# Patient Record
Sex: Female | Born: 2000
Health system: Southern US, Community
[De-identification: ages and names within clinical notes are randomized; demographics above are authoritative.]

## PROBLEM LIST (undated history)

## (undated) DIAGNOSIS — F909 Attention-deficit hyperactivity disorder, unspecified type: Secondary | ICD-10-CM

## (undated) HISTORY — DX: Attention-deficit hyperactivity disorder, unspecified type: F90.9

---
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
## 2018-01-26 ENCOUNTER — Other Ambulatory Visit: Payer: Self-pay | Admitting: Advanced Practice Midwife

## 2018-01-26 DIAGNOSIS — Z30013 Encounter for initial prescription of injectable contraceptive: Secondary | ICD-10-CM

## 2018-01-26 MED ORDER — MEDROXYPROGESTERONE ACETATE 150 MG/ML IM SUSP: 150.0000 mg | INTRAMUSCULAR | 3 refills | Status: DC

## 2018-01-26 NOTE — Progress Notes (Signed)
Requesting to restart Depo and D/C Nuvaring.   Katrinka Blazing, [place], CNM 01/26/2018 7:12 PM

## 2018-05-13 DIAGNOSIS — Z0389 Encounter for observation for other suspected diseases and conditions ruled out: Secondary | ICD-10-CM | POA: Diagnosis not present

## 2018-05-13 DIAGNOSIS — Z1388 Encounter for screening for disorder due to exposure to contaminants: Secondary | ICD-10-CM | POA: Diagnosis not present

## 2018-05-13 DIAGNOSIS — Z3009 Encounter for other general counseling and advice on contraception: Secondary | ICD-10-CM | POA: Diagnosis not present

## 2018-11-11 DIAGNOSIS — J029 Acute pharyngitis, unspecified: Secondary | ICD-10-CM | POA: Diagnosis not present

## 2018-11-11 DIAGNOSIS — Z1159 Encounter for screening for other viral diseases: Secondary | ICD-10-CM | POA: Diagnosis not present

## 2019-01-16 ENCOUNTER — Other Ambulatory Visit: Payer: Self-pay | Admitting: Advanced Practice Midwife

## 2019-01-16 DIAGNOSIS — Z30013 Encounter for initial prescription of injectable contraceptive: Secondary | ICD-10-CM

## 2019-01-20 ENCOUNTER — Other Ambulatory Visit: Payer: Self-pay | Admitting: *Deleted

## 2019-01-20 DIAGNOSIS — F329 Major depressive disorder, single episode, unspecified: Secondary | ICD-10-CM

## 2019-01-20 DIAGNOSIS — F32A Depression, unspecified: Secondary | ICD-10-CM

## 2019-01-20 DIAGNOSIS — Z30013 Encounter for initial prescription of injectable contraceptive: Secondary | ICD-10-CM

## 2019-01-20 MED ORDER — CITALOPRAM HYDROBROMIDE 20 MG PO TABS: 20.0000 mg | ORAL_TABLET | Freq: Every day | ORAL | 3 refills | Status: DC

## 2019-01-20 MED ORDER — MEDROXYPROGESTERONE ACETATE 150 MG/ML IM SUSY: 150.0000 mg | PREFILLED_SYRINGE | INTRAMUSCULAR | 3 refills | Status: DC

## 2019-01-25 DIAGNOSIS — U071 COVID-19: Secondary | ICD-10-CM | POA: Diagnosis not present

## 2019-01-25 DIAGNOSIS — J069 Acute upper respiratory infection, unspecified: Secondary | ICD-10-CM | POA: Diagnosis not present

## 2019-04-06 DIAGNOSIS — J039 Acute tonsillitis, unspecified: Secondary | ICD-10-CM | POA: Diagnosis not present

## 2019-04-07 DIAGNOSIS — Z3009 Encounter for other general counseling and advice on contraception: Secondary | ICD-10-CM | POA: Diagnosis not present

## 2019-04-07 DIAGNOSIS — Z0389 Encounter for observation for other suspected diseases and conditions ruled out: Secondary | ICD-10-CM | POA: Diagnosis not present

## 2019-04-07 DIAGNOSIS — Z114 Encounter for screening for human immunodeficiency virus [HIV]: Secondary | ICD-10-CM | POA: Diagnosis not present

## 2019-04-07 DIAGNOSIS — Z23 Encounter for immunization: Secondary | ICD-10-CM | POA: Diagnosis not present

## 2019-04-07 DIAGNOSIS — Z113 Encounter for screening for infections with a predominantly sexual mode of transmission: Secondary | ICD-10-CM | POA: Diagnosis not present

## 2019-04-07 DIAGNOSIS — Z1388 Encounter for screening for disorder due to exposure to contaminants: Secondary | ICD-10-CM | POA: Diagnosis not present

## 2019-04-30 DIAGNOSIS — Z202 Contact with and (suspected) exposure to infections with a predominantly sexual mode of transmission: Secondary | ICD-10-CM | POA: Diagnosis not present

## 2019-08-24 DIAGNOSIS — M545 Low back pain: Secondary | ICD-10-CM | POA: Diagnosis not present

## 2019-08-24 DIAGNOSIS — M9902 Segmental and somatic dysfunction of thoracic region: Secondary | ICD-10-CM | POA: Diagnosis not present

## 2019-08-24 DIAGNOSIS — M9903 Segmental and somatic dysfunction of lumbar region: Secondary | ICD-10-CM | POA: Diagnosis not present

## 2019-08-24 DIAGNOSIS — M9901 Segmental and somatic dysfunction of cervical region: Secondary | ICD-10-CM | POA: Diagnosis not present

## 2019-12-31 ENCOUNTER — Other Ambulatory Visit: Payer: Self-pay

## 2019-12-31 ENCOUNTER — Other Ambulatory Visit (HOSPITAL_COMMUNITY)
Admission: RE | Admit: 2019-12-31 | Discharge: 2019-12-31 | Disposition: A | Discharge status: Home / Self Care | Payer: Medicaid Other | Source: Ambulatory Visit | Attending: Family Medicine | Admitting: Family Medicine

## 2019-12-31 ENCOUNTER — Ambulatory Visit (INDEPENDENT_AMBULATORY_CARE_PROVIDER_SITE_OTHER): Payer: Medicaid Other | Admitting: Family Medicine

## 2019-12-31 ENCOUNTER — Encounter: Payer: Self-pay | Admitting: Family Medicine

## 2019-12-31 VITALS — BP 108/71 | HR 73 | Ht 62.0 in | Wt 125.2 lb

## 2019-12-31 DIAGNOSIS — Z23 Encounter for immunization: Secondary | ICD-10-CM | POA: Diagnosis not present

## 2019-12-31 DIAGNOSIS — Z113 Encounter for screening for infections with a predominantly sexual mode of transmission: Secondary | ICD-10-CM | POA: Diagnosis not present

## 2019-12-31 DIAGNOSIS — Z0000 Encounter for general adult medical examination without abnormal findings: Secondary | ICD-10-CM

## 2019-12-31 DIAGNOSIS — Z124 Encounter for screening for malignant neoplasm of cervix: Secondary | ICD-10-CM | POA: Diagnosis not present

## 2019-12-31 DIAGNOSIS — Z01419 Encounter for gynecological examination (general) (routine) without abnormal findings: Secondary | ICD-10-CM

## 2019-12-31 NOTE — Progress Notes (Signed)
Pt. Presents for annual exam Declines Flu vaccine at this time

## 2019-12-31 NOTE — Progress Notes (Signed)
  Subjective:     Kathy Bates is a 19 y.o. female and is here for a comprehensive physical exam. The patient reports no problems. Not in school right now. Working as a Dispensing optician. Stopped depo due to length of time. Uses condoms faithfully.   The following portions of the patient's history were reviewed and updated as appropriate: allergies, current medications, past family history, past medical history, past social history, past surgical history and problem list.  Review of Systems Pertinent items noted in HPI and remainder of comprehensive ROS otherwise negative.   Objective:    BP 108/71   Pulse 73   Ht 5\' 2"  (1.575 m)   Wt 125 lb 3.2 oz (56.8 kg)   BMI 22.90 kg/m  General appearance: alert, cooperative and appears stated age Head: Normocephalic, without obvious abnormality, atraumatic Neck: no adenopathy, supple, symmetrical, trachea midline and thyroid not enlarged, symmetric, no tenderness/mass/nodules Lungs: clear to auscultation bilaterally Breasts: normal appearance, no masses or tenderness Heart: regular rate and rhythm, S1, S2 normal, no murmur, click, rub or gallop Abdomen: soft, non-tender; bowel sounds normal; no masses,  no organomegaly Pelvic: cervix normal in appearance, external genitalia normal, no adnexal masses or tenderness, no cervical motion tenderness, uterus normal size, shape, and consistency and vagina normal without discharge Extremities: extremities normal, atraumatic, no cyanosis or edema Pulses: 2+ and symmetric Skin: Skin color, texture, turgor normal. No rashes or lesions Lymph nodes: Cervical, supraclavicular, and axillary nodes normal. Neurologic: Grossly normal    Assessment:    Healthy female exam.      Plan:     Screen for STD (sexually transmitted disease) - Plan: Hepatitis B surface antigen, Hepatitis C antibody, HIV Antibody (routine testing w rflx), RPR  Screening for malignant neoplasm of cervix - Plan: Cytology -  PAP  Encounter for gynecological examination without abnormal finding  Return in 1 year (on 12/30/2020).  See After Visit Summary for Counseling Recommendations

## 2019-12-31 NOTE — Patient Instructions (Signed)
 Preventive Care 21-21 Years Old, Female Preventive care refers to visits with your health care provider and lifestyle choices that can promote health and wellness. This includes:  A yearly physical exam. This may also be called an annual well check.  Regular dental visits and eye exams.  Immunizations.  Screening for certain conditions.  Healthy lifestyle choices, such as eating a healthy diet, getting regular exercise, not using drugs or products that contain nicotine and tobacco, and limiting alcohol use. What can I expect for my preventive care visit? Physical exam Your health care provider will check your:  Height and weight. This may be used to calculate body mass index (BMI), which tells if you are at a healthy weight.  Heart rate and blood pressure.  Skin for abnormal spots. Counseling Your health care provider may ask you questions about your:  Alcohol, tobacco, and drug use.  Emotional well-being.  Home and relationship well-being.  Sexual activity.  Eating habits.  Work and work environment.  Method of birth control.  Menstrual cycle.  Pregnancy history. What immunizations do I need?  Influenza (flu) vaccine  This is recommended every year. Tetanus, diphtheria, and pertussis (Tdap) vaccine  You may need a Td booster every 10 years. Varicella (chickenpox) vaccine  You may need this if you have not been vaccinated. Human papillomavirus (HPV) vaccine  If recommended by your health care provider, you may need three doses over 6 months. Measles, mumps, and rubella (MMR) vaccine  You may need at least one dose of MMR. You may also need a second dose. Meningococcal conjugate (MenACWY) vaccine  One dose is recommended if you are age 19-21 years and a first-year college student living in a residence hall, or if you have one of several medical conditions. You may also need additional booster doses. Pneumococcal conjugate (PCV13) vaccine  You may need  this if you have certain conditions and were not previously vaccinated. Pneumococcal polysaccharide (PPSV23) vaccine  You may need one or two doses if you smoke cigarettes or if you have certain conditions. Hepatitis A vaccine  You may need this if you have certain conditions or if you travel or work in places where you may be exposed to hepatitis A. Hepatitis B vaccine  You may need this if you have certain conditions or if you travel or work in places where you may be exposed to hepatitis B. Haemophilus influenzae type b (Hib) vaccine  You may need this if you have certain conditions. You may receive vaccines as individual doses or as more than one vaccine together in one shot (combination vaccines). Talk with your health care provider about the risks and benefits of combination vaccines. What tests do I need?  Blood tests  Lipid and cholesterol levels. These may be checked every 5 years starting at age 20.  Hepatitis C test.  Hepatitis B test. Screening  Diabetes screening. This is done by checking your blood sugar (glucose) after you have not eaten for a while (fasting).  Sexually transmitted disease (STD) testing.  BRCA-related cancer screening. This may be done if you have a family history of breast, ovarian, tubal, or peritoneal cancers.  Pelvic exam and Pap test. This may be done every 3 years starting at age 21. Starting at age 30, this may be done every 5 years if you have a Pap test in combination with an HPV test. Talk with your health care provider about your test results, treatment options, and if necessary, the need for more   tests. Follow these instructions at home: Eating and drinking   Eat a diet that includes fresh fruits and vegetables, whole grains, lean protein, and low-fat dairy.  Take vitamin and mineral supplements as recommended by your health care provider.  Do not drink alcohol if: ? Your health care provider tells you not to drink. ? You are  pregnant, may be pregnant, or are planning to become pregnant.  If you drink alcohol: ? Limit how much you have to 0-1 drink a day. ? Be aware of how much alcohol is in your drink. In the [place] one drink equals one 12 oz bottle of beer (355 mL), one 5 oz glass of wine (148 mL), or one 1 oz glass of hard liquor (44 mL). Lifestyle  Take daily care of your teeth and gums.  Stay active. Exercise for at least 30 minutes on 5 or more days each week.  Do not use any products that contain nicotine or tobacco, such as cigarettes, e-cigarettes, and chewing tobacco. If you need help quitting, ask your health care provider.  If you are sexually active, practice safe sex. Use a condom or other form of birth control (contraception) in order to prevent pregnancy and STIs (sexually transmitted infections). If you plan to become pregnant, see your health care provider for a preconception visit. What's next?  Visit your health care provider once a year for a well check visit.  Ask your health care provider how often you should have your eyes and teeth checked.  Stay up to date on all vaccines. This information is not intended to replace advice given to you by your health care provider. Make sure you discuss any questions you have with your health care provider. Document Revised: 12/19/2017 Document Reviewed: 12/19/2017 Elsevier Patient Education  2020 Elsevier Inc.  

## 2020-01-01 ENCOUNTER — Telehealth: Payer: Self-pay | Admitting: *Deleted

## 2020-01-01 LAB — HEPATITIS B SURFACE ANTIGEN: Hepatitis B Surface Ag: NEGATIVE

## 2020-01-01 LAB — RPR: RPR Ser Ql: NONREACTIVE

## 2020-01-01 LAB — HEPATITIS C ANTIBODY: Hep C Virus Ab: <0.1 s/co ratio (ref 0.0–0.9)

## 2020-01-01 LAB — HIV ANTIBODY (ROUTINE TESTING W REFLEX): HIV Screen 4th Generation wRfx: NONREACTIVE

## 2020-01-01 MED ORDER — FLUCONAZOLE 150 MG PO TABS: 150.0000 mg | ORAL_TABLET | Freq: Once | ORAL | 1 refills | Status: AC

## 2020-01-01 NOTE — Telephone Encounter (Signed)
Pt called requesting med for yeast, she forgot to ask Dr Shawnie Pons yesterday at her appt.

## 2020-01-04 LAB — CYTOLOGY - PAP
Adequacy: ABSENT
Chlamydia: NEGATIVE
Comment: NEGATIVE
Comment: NEGATIVE
Comment: NORMAL
Diagnosis: NEGATIVE
Neisseria Gonorrhea: NEGATIVE
Trichomonas: NEGATIVE

## 2020-02-01 ENCOUNTER — Other Ambulatory Visit: Payer: Self-pay

## 2020-02-01 DIAGNOSIS — Z79899 Other long term (current) drug therapy: Secondary | ICD-10-CM

## 2020-03-24 DIAGNOSIS — Z20822 Contact with and (suspected) exposure to covid-19: Secondary | ICD-10-CM | POA: Diagnosis not present

## 2020-03-24 DIAGNOSIS — R059 Cough, unspecified: Secondary | ICD-10-CM | POA: Diagnosis not present

## 2020-06-15 DIAGNOSIS — S6991XA Unspecified injury of right wrist, hand and finger(s), initial encounter: Secondary | ICD-10-CM | POA: Diagnosis not present

## 2020-07-11 ENCOUNTER — Telehealth: Payer: Medicaid Other | Admitting: Physician Assistant

## 2020-07-11 NOTE — Progress Notes (Signed)
Message sent to patient requesting further input regarding current symptoms. Awaiting patient response.  

## 2020-07-11 NOTE — Progress Notes (Signed)
[place]. Cannot send medication to [place]. Instructed her to reach out to PCP or be seen at local Urgent Care

## 2020-09-05 ENCOUNTER — Other Ambulatory Visit: Payer: Self-pay | Admitting: *Deleted

## 2020-09-05 DIAGNOSIS — F32A Depression, unspecified: Secondary | ICD-10-CM

## 2020-09-05 MED ORDER — CITALOPRAM HYDROBROMIDE 20 MG PO TABS: 20.0000 mg | ORAL_TABLET | Freq: Every day | ORAL | 1 refills | Status: DC

## 2020-11-23 ENCOUNTER — Encounter: Payer: Self-pay | Admitting: Radiology

## 2020-12-27 ENCOUNTER — Telehealth: Payer: Self-pay

## 2020-12-27 NOTE — Telephone Encounter (Signed)
Unable to leave a message vmail is full. Called to move pt from sept 22 to another date

## 2021-01-12 ENCOUNTER — Ambulatory Visit: Payer: Medicaid Other | Admitting: Family Medicine

## 2021-01-31 ENCOUNTER — Ambulatory Visit: Payer: Medicaid Other | Admitting: Family Medicine

## 2021-04-21 ENCOUNTER — Ambulatory Visit
Admission: EM | Admit: 2021-04-21 | Discharge: 2021-04-21 | Disposition: A | Discharge status: Home / Self Care | Payer: Medicaid Other | Attending: Emergency Medicine | Admitting: Emergency Medicine

## 2021-04-21 ENCOUNTER — Other Ambulatory Visit: Payer: Self-pay

## 2021-04-21 DIAGNOSIS — Z20822 Contact with and (suspected) exposure to covid-19: Secondary | ICD-10-CM

## 2021-04-21 DIAGNOSIS — B349 Viral infection, unspecified: Secondary | ICD-10-CM

## 2021-04-21 NOTE — ED Triage Notes (Signed)
Pt reports bilateral ear discomfort and sore throat since 04/13/21; pt was exposed to COVID on 04/16/21.

## 2021-04-21 NOTE — ED Provider Notes (Signed)
Kathy Bates    CSN: [id number] Arrival date & time: 04/21/21  7846      History   Chief Complaint Chief Complaint  Patient presents with   Otalgia    HPI Kathy Bates is a 20 y.o. female.  Patient presents with 6-day history of ears popping and sore throat.  She also reports chills, fatigue, headache, congestion, cough, shortness of breath x2 to 3 days.  She was exposed to someone with COVID on 04/16/2021.  Treatment at home with Tylenol.  She denies fever, rash, vomiting, diarrhea, or other symptoms.  Her medical history includes scoliosis, ADHD, anxiety, depression.    The history is provided by the patient and medical records.   Past Medical History:  Diagnosis Date   ADHD (attention deficit hyperactivity disorder)     Patient Active Problem List   Diagnosis Date Noted   Lactose intolerance 07/09/2017   Attention deficit hyperactivity disorder (ADHD) 10/18/2015   Other specified depressive episodes 10/18/2015   Parent-child relational problem 10/18/2015   Adolescent idiopathic scoliosis of thoracolumbar region 05/09/2015   Generalized anxiety disorder 05/09/2015   Shoulder pain 09/05/2012    History reviewed. No pertinent surgical history.  OB History     Gravida  0   Para  0   Term  0   Preterm  0   AB  0   Living  0      SAB  0   IAB  0   Ectopic  0   Multiple  0   Live Births               Home Medications    Prior to Admission medications   Medication Sig Start Date End Date Taking? Authorizing Provider  citalopram (CELEXA) 20 MG tablet Take 1 tablet (20 mg total) by mouth daily. 09/05/20   Reva Bores, MD  lactase (LACTAID) 3000 units tablet Take 1 tablet (3,000 Units total) by mouth 3 (three) times daily with meals. 07/09/17   Reva Bores, MD  medroxyPROGESTERone Acetate 150 MG/ML SUSY Inject 1 mL (150 mg total) into the muscle every 3 (three) months. Patient not taking: Reported on 12/31/2019 01/20/19   Reva Bores, MD    Family History Family History  Problem Relation Age of Onset   Diabetes Father    Hypertension Father     Social History Social History   Tobacco Use   Smoking status: Never   Smokeless tobacco: Never  Substance Use Topics   Alcohol use: No   Drug use: No     Allergies   Patient has no known allergies.   Review of Systems Review of Systems  Constitutional:  Positive for chills and fatigue. Negative for fever.  HENT:  Positive for congestion, ear pain and sore throat.   Eyes:  Negative for pain and visual disturbance.  Respiratory:  Positive for cough and shortness of breath.   Cardiovascular:  Negative for chest pain and palpitations.  Gastrointestinal:  Negative for diarrhea and vomiting.  Skin:  Negative for color change and rash.  Neurological:  Positive for headaches. Negative for syncope.  All other systems reviewed and are negative.   Physical Exam Triage Vital Signs ED Triage Vitals [04/21/21 0912]  Enc Vitals Group     BP 119/77     Pulse Rate 75     Resp 16     Temp 98.7 F (37.1 C)     Temp Source Oral  SpO2 95 %     Weight      Height      Head Circumference      Peak Flow      Pain Score      Pain Loc      Pain Edu?      Excl. in [place]?    No data found.  Updated Vital Signs BP 119/77 (BP Location: Left Arm)    Pulse 75    Temp 98.7 F (37.1 C) (Oral)    Resp 16    LMP  (Within Months) Comment: 1 month   SpO2 95%   Visual Acuity Right Eye Distance:   Left Eye Distance:   Bilateral Distance:    Right Eye Near:   Left Eye Near:    Bilateral Near:     Physical Exam Vitals and nursing note reviewed.  Constitutional:      General: She is not in acute distress.    Appearance: Normal appearance. She is well-developed.  HENT:     Right Ear: Tympanic membrane normal.     Left Ear: Tympanic membrane normal.     Nose: Congestion present.     Mouth/Throat:     Mouth: Mucous membranes are moist.     Pharynx: Oropharynx  is clear.  Cardiovascular:     Rate and Rhythm: Normal rate and regular rhythm.     Heart sounds: Normal heart sounds.  Pulmonary:     Effort: Pulmonary effort is normal. No respiratory distress.     Breath sounds: Normal breath sounds.  Abdominal:     Palpations: Abdomen is soft.     Tenderness: There is no abdominal tenderness.  Musculoskeletal:     Cervical back: Neck supple.  Skin:    General: Skin is warm and dry.  Neurological:     Mental Status: She is alert.  Psychiatric:        Mood and Affect: Mood normal.        Behavior: Behavior normal.     UC Treatments / Results  Labs (all labs ordered are listed, but only abnormal results are displayed) Labs Reviewed  NOVEL CORONAVIRUS, NAA    EKG   Radiology No results found.  Procedures Procedures (including critical care time)  Medications Ordered in UC Medications - No data to display  Initial Impression / Assessment and Plan / UC Course  I have reviewed the triage vital signs and the nursing notes.  Pertinent labs & imaging results that were available during my care of the patient were reviewed by me and considered in my medical decision making (see chart for details).   Exposure to COVID. Viral illness.  COVID pending.  Instructed patient to self quarantine per CDC guidelines.  Discussed symptomatic treatment including Tylenol or ibuprofen, rest, hydration.  Instructed patient to follow up with PCP if symptoms are not improving.  Patient agrees to plan of care.    Final Clinical Impressions(s) / UC Diagnoses   Final diagnoses:  Exposure to COVID-19 virus  Viral illness     Discharge Instructions      Your COVID is pending.  You should self quarantine until the test result is back.    Take Tylenol or ibuprofen as needed for fever or discomfort.  Rest and keep yourself hydrated.    Follow-up with your primary care provider if your symptoms are not improving.         ED Prescriptions   None     PDMP not  reviewed this encounter.   Sharion Balloon, NP 04/21/21 [phone]

## 2021-04-21 NOTE — Discharge Instructions (Addendum)
Your COVID is pending.  You should self quarantine until the test result is back.    Take Tylenol or ibuprofen as needed for fever or discomfort.  Rest and keep yourself hydrated.    Follow-up with your primary care provider if your symptoms are not improving.

## 2021-04-22 LAB — NOVEL CORONAVIRUS, NAA: SARS-CoV-2, NAA: DETECTED — AB

## 2021-04-22 LAB — SARS-COV-2, NAA 2 DAY TAT

## 2021-05-15 ENCOUNTER — Other Ambulatory Visit: Payer: Self-pay | Admitting: *Deleted

## 2021-05-15 DIAGNOSIS — F32A Depression, unspecified: Secondary | ICD-10-CM

## 2021-05-15 MED ORDER — CITALOPRAM HYDROBROMIDE 20 MG PO TABS: 20.0000 mg | ORAL_TABLET | Freq: Every day | ORAL | 1 refills | Status: DC

## 2021-07-05 ENCOUNTER — Ambulatory Visit: Payer: Medicaid Other | Admitting: Family Medicine

## 2021-08-23 ENCOUNTER — Encounter: Payer: Self-pay | Admitting: Family Medicine

## 2021-08-23 ENCOUNTER — Other Ambulatory Visit (HOSPITAL_COMMUNITY)
Admission: RE | Admit: 2021-08-23 | Discharge: 2021-08-23 | Disposition: A | Discharge status: Home / Self Care | Payer: Medicaid Other | Source: Ambulatory Visit | Attending: Family Medicine | Admitting: Family Medicine

## 2021-08-23 ENCOUNTER — Ambulatory Visit (INDEPENDENT_AMBULATORY_CARE_PROVIDER_SITE_OTHER): Payer: Medicaid Other | Admitting: Family Medicine

## 2021-08-23 VITALS — BP 108/70 | HR 79 | Ht 62.0 in | Wt 117.0 lb

## 2021-08-23 DIAGNOSIS — Z124 Encounter for screening for malignant neoplasm of cervix: Secondary | ICD-10-CM | POA: Diagnosis not present

## 2021-08-23 DIAGNOSIS — Z01419 Encounter for gynecological examination (general) (routine) without abnormal findings: Secondary | ICD-10-CM | POA: Diagnosis not present

## 2021-08-23 DIAGNOSIS — N898 Other specified noninflammatory disorders of vagina: Secondary | ICD-10-CM | POA: Diagnosis not present

## 2021-08-23 DIAGNOSIS — Z113 Encounter for screening for infections with a predominantly sexual mode of transmission: Secondary | ICD-10-CM | POA: Diagnosis not present

## 2021-08-23 DIAGNOSIS — F331 Major depressive disorder, recurrent, moderate: Secondary | ICD-10-CM | POA: Insufficient documentation

## 2021-08-23 HISTORY — DX: Major depressive disorder, recurrent, moderate: F33.1

## 2021-08-23 MED ORDER — CITALOPRAM HYDROBROMIDE 20 MG PO TABS: 20.0000 mg | ORAL_TABLET | Freq: Every day | ORAL | 1 refills | Status: DC

## 2021-08-23 NOTE — Patient Instructions (Signed)
Preventive Care 21-23 Years Old, Female ?Preventive care refers to lifestyle choices and visits with your health care provider that can promote health and wellness. Preventive care visits are also called wellness exams. ?What can I expect for my preventive care visit? ?Counseling ?During your preventive care visit, your health care provider may ask about your: ?Medical history, including: ?Past medical problems. ?Family medical history. ?Pregnancy history. ?Current health, including: ?Menstrual cycle. ?Method of birth control. ?Emotional well-being. ?Home life and relationship well-being. ?Sexual activity and sexual health. ?Lifestyle, including: ?Alcohol, nicotine or tobacco, and drug use. ?Access to firearms. ?Diet, exercise, and sleep habits. ?Work and work environment. ?Sunscreen use. ?Safety issues such as seatbelt and bike helmet use. ?Physical exam ?Your health care provider may check your: ?Height and weight. These may be used to calculate your BMI (body mass index). BMI is a measurement that tells if you are at a healthy weight. ?Waist circumference. This measures the distance around your waistline. This measurement also tells if you are at a healthy weight and may help predict your risk of certain diseases, such as type 2 diabetes and high blood pressure. ?Heart rate and blood pressure. ?Body temperature. ?Skin for abnormal spots. ?What immunizations do I need? ? ?Vaccines are usually given at various ages, according to a schedule. Your health care provider will recommend vaccines for you based on your age, medical history, and lifestyle or other factors, such as travel or where you work. ?What tests do I need? ?Screening ?Your health care provider may recommend screening tests for certain conditions. This may include: ?Pelvic exam and Pap test. ?Lipid and cholesterol levels. ?Diabetes screening. This is done by checking your blood sugar (glucose) after you have not eaten for a while (fasting). ?Hepatitis  B test. ?Hepatitis C test. ?HIV (human immunodeficiency virus) test. ?STI (sexually transmitted infection) testing, if you are at risk. ?BRCA-related cancer screening. This may be done if you have a family history of breast, ovarian, tubal, or peritoneal cancers. ?Talk with your health care provider about your test results, treatment options, and if necessary, the need for more tests. ?Follow these instructions at home: ?Eating and drinking ? ?Eat a healthy diet that includes fresh fruits and vegetables, whole grains, lean protein, and low-fat dairy products. ?Take vitamin and mineral supplements as recommended by your health care provider. ?Do not drink alcohol if: ?Your health care provider tells you not to drink. ?You are pregnant, may be pregnant, or are planning to become pregnant. ?If you drink alcohol: ?Limit how much you have to 0-1 drink a day. ?Know how much alcohol is in your drink. In the [place] one drink equals one 12 oz bottle of beer (355 mL), one 5 oz glass of wine (148 mL), or one 1? oz glass of hard liquor (44 mL). ?Lifestyle ?Brush your teeth every morning and night with fluoride toothpaste. Floss one time each day. ?Exercise for at least 30 minutes 5 or more days each week. ?Do not use any products that contain nicotine or tobacco. These products include cigarettes, chewing tobacco, and vaping devices, such as e-cigarettes. If you need help quitting, ask your health care provider. ?Do not use drugs. ?If you are sexually active, practice safe sex. Use a condom or other form of protection to prevent STIs. ?If you do not wish to become pregnant, use a form of birth control. If you plan to become pregnant, see your health care provider for a prepregnancy visit. ?Find healthy ways to manage stress, such as: ?Meditation,   yoga, or listening to music. ?Journaling. ?Talking to a trusted person. ?Spending time with friends and family. ?Minimize exposure to UV radiation to reduce your risk of skin  cancer. ?Safety ?Always wear your seat belt while driving or riding in a vehicle. ?Do not drive: ?If you have been drinking alcohol. Do not ride with someone who has been drinking. ?If you have been using any mind-altering substances or drugs. ?While texting. ?When you are tired or distracted. ?Wear a helmet and other protective equipment during sports activities. ?If you have firearms in your house, make sure you follow all gun safety procedures. ?Seek help if you have been physically or sexually abused. ?What's next? ?Go to your health care provider once a year for an annual wellness visit. ?Ask your health care provider how often you should have your eyes and teeth checked. ?Stay up to date on all vaccines. ?This information is not intended to replace advice given to you by your health care provider. Make sure you discuss any questions you have with your health care provider. ?Document Revised: 10/05/2020 Document Reviewed: 10/05/2020 ?Elsevier Patient Education ? [place]. ? ?

## 2021-08-23 NOTE — Progress Notes (Signed)
Patient presents for Annual Exam. ? ?LMP:07/25/21 ?Contraception: None at this time however wants to discuss Depo notes unprotected intercourse last week "pull out method". ?STD Screening: Desires  ?Last pap: 12/2019 Wants pap. ?Family Hx of Breast Cancer: None  ? ?Pt wants to discuss management of medication for ADD notes going back to school here soon.  ?Also needs discuss Celexa.  ? ? ? ?

## 2021-08-23 NOTE — Progress Notes (Signed)
Subjective:  ?  ? Kathy Bates is a 21 y.o. female and is here for a comprehensive physical exam. The patient reports no problems. Wants to get back on Depo. LMP 07/25/2021 with unprotected intercourse last week.  ?Wants to resume her Celexa, has been taking intermittently secondary to side fx of dizziness, feels like zombie. Etc. ?Wants to return to school and resume her ADD meds--on Focalin in the past with stopping it in elementary school, because she did not eat--and grades went from As to C/Ds. ? ? ?The following portions of the patient's history were reviewed and updated as appropriate: allergies, current medications, past family history, past medical history, past social history, past surgical history, and problem list. ? ?Review of Systems ?Pertinent items noted in HPI and remainder of comprehensive ROS otherwise negative.  ? ?Objective:  ? ? BP 108/70   Pulse 79   Ht 5\' 2"  (1.575 m)   Wt 117 lb (53.1 kg)   LMP 07/25/2021   BMI 21.40 kg/m?  ?General appearance: alert, cooperative, and appears stated age ?Head: Normocephalic, without obvious abnormality, atraumatic ?Neck: no adenopathy, supple, symmetrical, trachea midline, and thyroid not enlarged, symmetric, no tenderness/mass/nodules ?Lungs: clear to auscultation bilaterally ?Breasts: normal appearance, no masses or tenderness ?Heart: regular rate and rhythm, S1, S2 normal, no murmur, click, rub or gallop ?Abdomen: soft, non-tender; bowel sounds normal; no masses,  no organomegaly ?Pelvic: cervix normal in appearance, external genitalia normal, no adnexal masses or tenderness, no cervical motion tenderness, uterus normal size, shape, and consistency, and vagina normal without discharge ?Extremities: extremities normal, atraumatic, no cyanosis or edema ?Pulses: 2+ and symmetric ?Skin: Skin color, texture, turgor normal. No rashes or lesions ?Lymph nodes: Cervical, supraclavicular, and axillary nodes normal. ?Neurologic: Grossly normal  ?   ?Assessment:  ? ? Healthy female exam.    ?  ?Plan:  ? ?Problem List Items Addressed This Visit   ? ?  ? Unprioritized  ? Moderate episode of recurrent major depressive disorder ([name])  ?  Resume Celexa--start with 1/2 tab and increase to full tab--discussed other options--has some anxiety as well--Has been on zoloft and lexapro in the past and had to stop them for a variety of reasons. ? ?  ?  ? Relevant Medications  ? citalopram (CELEXA) 20 MG tablet  ? ?Other Visit Diagnoses   ? ? Screening for malignant neoplasm of cervix    -  Primary  ? Relevant Orders  ? Cytology - PAP  ? Encounter for gynecological examination without abnormal finding      ? Screen for STD (sexually transmitted disease)      ? Relevant Orders  ? Hepatitis C antibody  ? Hepatitis B surface antigen  ? RPR  ? HIV Antibody (routine testing w rflx)  ? Vaginal discharge      ? Relevant Orders  ? Cervicovaginal ancillary only( [place])  ? ?  ? ?Return in 8 weeks (on 10/18/2021) for primary care referral, a follow-up, virtual--rtn w/ menses or in 2 weeks with neg UPT for Depo. ? ? ?See After Visit Summary for Counseling Recommendations  ? ?

## 2021-08-23 NOTE — Assessment & Plan Note (Signed)
Resume Celexa--start with 1/2 tab and increase to full tab--discussed other options--has some anxiety as well--Has been on zoloft and lexapro in the past and had to stop them for a variety of reasons. ?

## 2021-08-24 LAB — HEPATITIS B SURFACE ANTIGEN: Hepatitis B Surface Ag: NEGATIVE

## 2021-08-24 LAB — CERVICOVAGINAL ANCILLARY ONLY
Bacterial Vaginitis (gardnerella): NEGATIVE
Candida Glabrata: NEGATIVE
Candida Vaginitis: NEGATIVE
Comment: NEGATIVE
Comment: NEGATIVE
Comment: NEGATIVE

## 2021-08-24 LAB — RPR: RPR Ser Ql: NONREACTIVE

## 2021-08-24 LAB — HEPATITIS C ANTIBODY: Hep C Virus Ab: NONREACTIVE

## 2021-08-24 LAB — HIV ANTIBODY (ROUTINE TESTING W REFLEX): HIV Screen 4th Generation wRfx: NONREACTIVE

## 2021-08-25 LAB — CYTOLOGY - PAP
Adequacy: ABSENT
Chlamydia: NEGATIVE
Comment: NEGATIVE
Comment: NORMAL
Diagnosis: NEGATIVE
Neisseria Gonorrhea: NEGATIVE

## 2021-09-04 DIAGNOSIS — F320 Major depressive disorder, single episode, mild: Secondary | ICD-10-CM | POA: Diagnosis not present

## 2021-09-11 DIAGNOSIS — F320 Major depressive disorder, single episode, mild: Secondary | ICD-10-CM | POA: Diagnosis not present

## 2021-09-25 DIAGNOSIS — F320 Major depressive disorder, single episode, mild: Secondary | ICD-10-CM | POA: Diagnosis not present

## 2021-10-03 DIAGNOSIS — F320 Major depressive disorder, single episode, mild: Secondary | ICD-10-CM | POA: Diagnosis not present

## 2021-10-09 DIAGNOSIS — F320 Major depressive disorder, single episode, mild: Secondary | ICD-10-CM | POA: Diagnosis not present

## 2021-10-11 ENCOUNTER — Telehealth: Payer: Medicaid Other | Admitting: Family Medicine

## 2021-10-11 ENCOUNTER — Encounter: Payer: Self-pay | Admitting: Family Medicine

## 2021-10-11 DIAGNOSIS — Z3042 Encounter for surveillance of injectable contraceptive: Secondary | ICD-10-CM

## 2021-10-11 DIAGNOSIS — F331 Major depressive disorder, recurrent, moderate: Secondary | ICD-10-CM

## 2021-10-11 NOTE — Assessment & Plan Note (Signed)
Continue Celexa

## 2021-10-11 NOTE — Progress Notes (Signed)
Virtual Follow-Up visit.  Patient states she has no complaints or concerns.

## 2021-10-11 NOTE — Progress Notes (Signed)
    GYNECOLOGY VIRTUAL VISIT ENCOUNTER NOTE  Provider location: [facility] for [facility]   Patient location: Home  I connected with Kathy Bates on 10/11/21 at 11:15 AM EDT by MyChart Video Encounter and verified that I am speaking with the correct person using two identifiers.   I discussed the limitations, risks, security and privacy concerns of performing an evaluation and management service virtually and the availability of in person appointments. I also discussed with the patient that there may be a patient responsible charge related to this service. The patient expressed understanding and agreed to proceed.   History:  Kathy Bates is a 21 y.o. G0P0000 female being evaluated today for possible depo. Resumed her Celexa--it makes her sleepy. She will take it at night. She denies any abnormal vaginal discharge, bleeding, pelvic pain or other concerns.      Past Medical History:  Diagnosis Date   ADHD (attention deficit hyperactivity disorder)    History reviewed. No pertinent surgical history. The following portions of the patient's history were reviewed and updated as appropriate: allergies, current medications, past family history, past medical history, past social history, past surgical history and problem list.   Health Maintenance:  Normal pap and on 08/24/2022.   Review of Systems:  Pertinent items noted in HPI and remainder of comprehensive ROS otherwise negative.  Physical Exam:   General:  Alert, oriented and cooperative. Patient appears to be in no acute distress.  Mental Status: Normal mood and affect. Normal behavior. Normal judgment and thought content.   Respiratory: Normal respiratory effort, no problems with respiration noted  Rest of physical exam deferred due to type of encounter  Labs and Imaging No results found for this or any previous visit (from the past 336 hour(s)). No results found.     Assessment and Plan:      Problem List Items Addressed This Visit       Unprioritized   Moderate episode of recurrent major depressive disorder (HCC)    Continue Celexa      Other Visit Diagnoses     Encounter for surveillance of injectable contraceptive    -  Primary   return with menses and can resume Depo            I discussed the assessment and treatment plan with the patient. The patient was provided an opportunity to ask questions and all were answered. The patient agreed with the plan and demonstrated an understanding of the instructions.   The patient was advised to call back or seek an in-person evaluation/go to the ED if the symptoms worsen or if the condition fails to improve as anticipated.  I provided 5 minutes of face-to-face time during this encounter.   Reva Bores, MD Center for [employer], [facility]

## 2021-10-16 DIAGNOSIS — F320 Major depressive disorder, single episode, mild: Secondary | ICD-10-CM | POA: Diagnosis not present

## 2021-10-17 ENCOUNTER — Other Ambulatory Visit: Payer: Medicaid Other

## 2021-10-17 DIAGNOSIS — N912 Amenorrhea, unspecified: Secondary | ICD-10-CM | POA: Diagnosis not present

## 2021-10-18 LAB — BETA HCG QUANT (REF LAB): hCG Quant: 557 m[IU]/mL

## 2021-10-23 DIAGNOSIS — F320 Major depressive disorder, single episode, mild: Secondary | ICD-10-CM | POA: Diagnosis not present

## 2021-11-03 DIAGNOSIS — F320 Major depressive disorder, single episode, mild: Secondary | ICD-10-CM | POA: Diagnosis not present

## 2021-11-06 ENCOUNTER — Ambulatory Visit (INDEPENDENT_AMBULATORY_CARE_PROVIDER_SITE_OTHER): Payer: Medicaid Other | Admitting: *Deleted

## 2021-11-06 ENCOUNTER — Ambulatory Visit (INDEPENDENT_AMBULATORY_CARE_PROVIDER_SITE_OTHER): Payer: Medicaid Other

## 2021-11-06 VITALS — BP 109/69 | HR 83 | Wt 112.0 lb

## 2021-11-06 DIAGNOSIS — Z3401 Encounter for supervision of normal first pregnancy, first trimester: Secondary | ICD-10-CM

## 2021-11-06 DIAGNOSIS — Z3400 Encounter for supervision of normal first pregnancy, unspecified trimester: Secondary | ICD-10-CM | POA: Insufficient documentation

## 2021-11-06 DIAGNOSIS — Z3A01 Less than 8 weeks gestation of pregnancy: Secondary | ICD-10-CM | POA: Diagnosis not present

## 2021-11-06 DIAGNOSIS — F320 Major depressive disorder, single episode, mild: Secondary | ICD-10-CM | POA: Diagnosis not present

## 2021-11-06 DIAGNOSIS — O3680X0 Pregnancy with inconclusive fetal viability, not applicable or unspecified: Secondary | ICD-10-CM

## 2021-11-06 NOTE — Progress Notes (Cosign Needed)
New OB Intake  I explained I am completing New OB Intake today. We discussed her EDD of 06/28/22 that is based on today's scan. LMP of unknown. Pt is G1/P0. I reviewed her allergies, medications, Medical/Surgical/OB history, and appropriate screenings.   Patient Active Problem List   Diagnosis Date Noted   Encounter for supervision of normal first pregnancy in first trimester 11/06/2021   Attention deficit hyperactivity disorder (ADHD) 10/18/2015   Adolescent idiopathic scoliosis of thoracolumbar region 05/09/2015   Generalized anxiety disorder 05/09/2015    Concerns addressed today  Delivery Plans:  Plans to deliver at [facility]   MyChart/Babyscripts MyChart access verified. I explained pt will have some visits in office and some virtually. Babyscripts app discussed and ordered.   Anatomy US Explained first scheduled [place] will be around 19 weeks.   Labs Discussed Avelina Laine genetic screening with patient. Would like both Panorama and Horizon drawn at new OB visit. Routine prenatal labs needed.   Placed OB Box on problem list and updated   Patient informed that the ultrasound is considered a limited obstetric ultrasound and is not intended to be a complete ultrasound exam.  Patient also informed that the ultrasound is not being completed with the intent of assessing for fetal or placental anomalies or any pelvic abnormalities. Explained that the purpose of today's ultrasound is to assess for dating and fetal heart rate.  Patient acknowledges the purpose of the exam and the limitations of the study.      First visit review I reviewed new OB appt with pt. I explained she will have ob bloodwork with genetic screening. Explained pt will be seen by Thalia Bloodgood, CNM at first visit.    Scheryl Marten, RN 11/06/2021  12:02 PM

## 2021-11-08 NOTE — Progress Notes (Signed)
Attestation of Attending Supervision of clinical support staff: I agree with the care provided to this patient and was available for any consultation.  I have reviewed the RN's note and chart. I was available for consult and to see the patient if needed.   Alexxia Stankiewicz MD MPH Attending Physician Faculty Practice- [facility]  

## 2021-11-11 DIAGNOSIS — Z3201 Encounter for pregnancy test, result positive: Secondary | ICD-10-CM | POA: Diagnosis not present

## 2021-11-11 DIAGNOSIS — Z3A01 Less than 8 weeks gestation of pregnancy: Secondary | ICD-10-CM | POA: Diagnosis not present

## 2021-11-11 DIAGNOSIS — R110 Nausea: Secondary | ICD-10-CM | POA: Diagnosis not present

## 2021-11-11 DIAGNOSIS — O219 Vomiting of pregnancy, unspecified: Secondary | ICD-10-CM | POA: Diagnosis not present

## 2021-11-20 DIAGNOSIS — F320 Major depressive disorder, single episode, mild: Secondary | ICD-10-CM | POA: Diagnosis not present

## 2021-12-04 DIAGNOSIS — F320 Major depressive disorder, single episode, mild: Secondary | ICD-10-CM | POA: Diagnosis not present

## 2021-12-06 ENCOUNTER — Other Ambulatory Visit (HOSPITAL_COMMUNITY)
Admission: RE | Admit: 2021-12-06 | Discharge: 2021-12-06 | Disposition: A | Discharge status: Home / Self Care | Payer: Medicaid Other | Source: Ambulatory Visit | Attending: Family Medicine | Admitting: Family Medicine

## 2021-12-06 ENCOUNTER — Ambulatory Visit (INDEPENDENT_AMBULATORY_CARE_PROVIDER_SITE_OTHER): Payer: Medicaid Other | Admitting: *Deleted

## 2021-12-06 VITALS — BP 112/77 | HR 80

## 2021-12-06 DIAGNOSIS — Z113 Encounter for screening for infections with a predominantly sexual mode of transmission: Secondary | ICD-10-CM | POA: Insufficient documentation

## 2021-12-06 LAB — OB RESULTS CONSOLE GC/CHLAMYDIA
Chlamydia: NEGATIVE
Neisseria Gonorrhea: NEGATIVE

## 2021-12-06 NOTE — Progress Notes (Cosign Needed)
SUBJECTIVE:  21 y.o. female who desires a STI screen. Denies abnormal vaginal discharge, bleeding or significant pelvic pain. No UTI symptoms. Possibly exposed to Trich from previous partner.  Patient's last menstrual period was 08/21/2021 (approximate).  OBJECTIVE:  She appears well.   ASSESSMENT:  STI Screen   PLAN:  Pt offered STI blood screening-not indicated GC, chlamydia, and trichomonas, CVAG, BVAG  probe sent to lab.  Treatment: To be determined once lab results are received.  Pt follow up as needed.   Scheryl Marten, RN

## 2021-12-06 NOTE — Progress Notes (Addendum)
Patient seen and assessed by nursing staff.  Agree with documentation and plan.Has BV and yeast, rx given

## 2021-12-08 ENCOUNTER — Other Ambulatory Visit: Payer: Self-pay | Admitting: Family Medicine

## 2021-12-08 LAB — CERVICOVAGINAL ANCILLARY ONLY
Bacterial Vaginitis (gardnerella): POSITIVE — AB
Candida Glabrata: NEGATIVE
Candida Vaginitis: POSITIVE — AB
Chlamydia: NEGATIVE
Comment: NEGATIVE
Comment: NEGATIVE
Comment: NEGATIVE
Comment: NEGATIVE
Comment: NEGATIVE
Comment: NORMAL
Neisseria Gonorrhea: NEGATIVE
Trichomonas: NEGATIVE

## 2021-12-08 MED ORDER — METRONIDAZOLE 500 MG PO TABS: 500.0000 mg | ORAL_TABLET | Freq: Two times a day (BID) | ORAL | 0 refills | Status: DC

## 2021-12-08 MED ORDER — FLUCONAZOLE 150 MG PO TABS: 150.0000 mg | ORAL_TABLET | Freq: Once | ORAL | 0 refills | Status: AC

## 2021-12-08 NOTE — Addendum Note (Signed)
Addended by: Reva Bores on: 12/08/2021 03:10 PM   Modules accepted: Orders

## 2021-12-11 ENCOUNTER — Other Ambulatory Visit: Payer: Self-pay | Admitting: *Deleted

## 2021-12-11 MED ORDER — TERCONAZOLE 0.8 % VA CREA: 1.0000 | TOPICAL_CREAM | Freq: Every day | VAGINAL | 0 refills | Status: DC

## 2021-12-11 MED ORDER — METRONIDAZOLE 500 MG PO TABS: 500.0000 mg | ORAL_TABLET | Freq: Two times a day (BID) | ORAL | 0 refills | Status: DC

## 2021-12-12 ENCOUNTER — Ambulatory Visit: Payer: Medicaid Other

## 2021-12-14 ENCOUNTER — Ambulatory Visit (INDEPENDENT_AMBULATORY_CARE_PROVIDER_SITE_OTHER): Payer: Medicaid Other | Admitting: Advanced Practice Midwife

## 2021-12-14 VITALS — BP 112/71 | HR 84 | Wt 116.0 lb

## 2021-12-14 DIAGNOSIS — Z3A12 12 weeks gestation of pregnancy: Secondary | ICD-10-CM

## 2021-12-14 DIAGNOSIS — N898 Other specified noninflammatory disorders of vagina: Secondary | ICD-10-CM

## 2021-12-14 DIAGNOSIS — Z3401 Encounter for supervision of normal first pregnancy, first trimester: Secondary | ICD-10-CM | POA: Diagnosis not present

## 2021-12-14 DIAGNOSIS — F411 Generalized anxiety disorder: Secondary | ICD-10-CM

## 2021-12-14 LAB — OB RESULTS CONSOLE RUBELLA ANTIBODY, IGM: Rubella: IMMUNE

## 2021-12-14 LAB — OB RESULTS CONSOLE HIV ANTIBODY (ROUTINE TESTING): HIV: NONREACTIVE

## 2021-12-14 LAB — OB RESULTS CONSOLE RPR: RPR: NONREACTIVE

## 2021-12-14 LAB — OB RESULTS CONSOLE HEPATITIS B SURFACE ANTIGEN: Hepatitis B Surface Ag: NEGATIVE

## 2021-12-14 LAB — HEPATITIS C ANTIBODY: HCV Ab: NEGATIVE

## 2021-12-14 NOTE — Progress Notes (Signed)
NOB   Last pap: 08/23/2021 WNL  GC/CT : 12/06/21 Negative + BV +Yeast  Genetic Screening: Desires   CC: Pt had bleeding after intercourse 1 wk ago when wiping none since.

## 2021-12-14 NOTE — Progress Notes (Signed)
History:   Kathy Bates is a 21 y.o. G1P0000 at [redacted]w[redacted]d by early ultrasound being seen today for her first obstetrical visit.  Her obstetrical history does not have significant factors. Patient does intend to breast feed. Pregnancy history fully reviewed.  Patient reports  ongoing nausea which is significantly improved from early pregnancy . She works at [employer] in [place]. She rides horses but also trains them, engages in mostly calm trail rides and would like to continue this in pregnancy.      HISTORY: OB History  Gravida Para Term Preterm AB Living  1 0 0 0 0 0  SAB IAB Ectopic Multiple Live Births  0 0 0 0 0    # Outcome Date GA Lbr Len/2nd Weight Sex Delivery Anes PTL Lv  1 Current             Last pap smear was done 08/2021 and was normal  Past Medical History:  Diagnosis Date   ADHD (attention deficit hyperactivity disorder)    Moderate episode of recurrent major depressive disorder (HCC) 08/23/2021   Parent-child relational problem 10/18/2015   No past surgical history on file. Family History  Problem Relation Age of Onset   Diabetes Father    Hypertension Father    Social History   Tobacco Use   Smoking status: Never   Smokeless tobacco: Never  Vaping Use   Vaping Use: Never used  Substance Use Topics   Alcohol use: Yes    Comment: social   Drug use: No   No Known Allergies Current Outpatient Medications on File Prior to Visit  Medication Sig Dispense Refill   citalopram (CELEXA) 20 MG tablet Take 1 tablet (20 mg total) by mouth daily. 90 tablet 1   lactase (LACTAID) 3000 units tablet Take 1 tablet (3,000 Units total) by mouth 3 (three) times daily with meals. 90 tablet 3   Prenatal Vit-Fe Fumarate-FA (MULTIVITAMIN-PRENATAL) 27-0.8 MG TABS tablet Take 1 tablet by mouth daily at 12 noon.     terconazole (TERAZOL 3) 0.8 % vaginal cream Place 1 applicator vaginally at bedtime. Apply nightly for three nights. 20 g 0   medroxyPROGESTERone Acetate 150  MG/ML SUSY Inject 1 mL (150 mg total) into the muscle every 3 (three) months. (Patient not taking: Reported on 12/31/2019) 1 mL 3   metroNIDAZOLE (FLAGYL) 500 MG tablet Take 1 tablet (500 mg total) by mouth 2 (two) times daily. (Patient not taking: Reported on 12/14/2021) 14 tablet 0   No current facility-administered medications on file prior to visit.    Review of Systems Pertinent items noted in HPI and remainder of comprehensive ROS otherwise negative.   Physical Exam:   Vitals:   12/14/21 1433  BP: 112/71  Pulse: 84  Weight: 116 lb (52.6 kg)   Fetal Heart Rate (bpm): 165  by Doppler General: well-developed, well-nourished female in no acute distress  Skin: normal coloration and turgor, no rashes  Neurologic: oriented, normal, negative, normal mood  Extremities: normal strength, tone, and muscle mass, ROM of all joints is normal  HEENT PERRLA, extraocular movement intact and sclera clear, anicteric  Neck supple and no masses  Cardiovascular: regular rate and rhythm  Respiratory:  no respiratory distress, normal breath sounds    Assessment:    Pregnancy: G1P0000 Patient Active Problem List   Diagnosis Date Noted   Encounter for supervision of normal first pregnancy in first trimester 11/06/2021   Attention deficit hyperactivity disorder (ADHD) 10/18/2015   Adolescent idiopathic  scoliosis of thoracolumbar region 05/09/2015   Generalized anxiety disorder 05/09/2015     Plan:    1. Encounter for supervision of normal first pregnancy in first trimester - Welcome to practice! - CBC/D/Plt+RPR+Rh+ABO+RubIgG... - Culture, OB Urine - Panorama Prenatal Test Full Panel - [employer] - [place] MFM OB COMP + 14 WK; Future  2. [redacted] weeks gestation of pregnancy   3. Vaginal discharge - Resolved without intervention. Has meds if recurrence   Initial labs drawn. Continue prenatal vitamins. Problem list reviewed and updated. Genetic Screening discussed, Panorama and Horizon:  ordered. Ultrasound discussed; fetal anatomic survey: scheduled. Anticipatory guidance about prenatal visits given including labs, ultrasounds, and testing. Weight gain recommendations per IOM guidelines reviewed: underweight/BMI 18.5 or less > 28 - 40 lbs; normal weight/BMI 18.5 - 24.9 > 25 - 35 lbs; overweight/BMI 25 - 29.9 > 15 - 25 lbs; obese/BMI  30 or more > 11 - 20 lbs. Discussed usage of the Babyscripts app for more information about pregnancy, and to track blood pressures. Also discussed usage of virtual visits as additional source of managing and completing prenatal visits.  Patient was encouraged to use MyChart to review results, send requests, and have questions addressed.   The nature of [place] [facility] Healthcare/Faculty Practice with multiple MDs and Advanced Practice Providers was explained to patient; also emphasized that residents, students are part of our team. Routine obstetric precautions reviewed. Encouraged to seek out care at our office or emergency room [facility] preferred) for urgent and/or emergent concerns. Return in about 4 weeks (around 01/11/2022) for MD or APP.     Clayton Bibles, MSA, MSN, CNM Certified Nurse Midwife, Biochemist, clinical for [employer], [facility]

## 2021-12-14 NOTE — Patient Instructions (Signed)

## 2021-12-15 LAB — CBC/D/PLT+RPR+RH+ABO+RUBIGG...
Antibody Screen: NEGATIVE
Basophils Absolute: 0 10*3/uL (ref 0.0–0.2)
Basos: 0 %
EOS (ABSOLUTE): 0.1 10*3/uL (ref 0.0–0.4)
Eos: 1 %
HCV Ab: NONREACTIVE
HIV Screen 4th Generation wRfx: NONREACTIVE
Hematocrit: 39 % (ref 34.0–46.6)
Hemoglobin: 13.3 g/dL (ref 11.1–15.9)
Hepatitis B Surface Ag: NEGATIVE
Immature Grans (Abs): 0 10*3/uL (ref 0.0–0.1)
Immature Granulocytes: 0 %
Lymphocytes Absolute: 1.8 10*3/uL (ref 0.7–3.1)
Lymphs: 22 %
MCH: 31.1 pg (ref 26.6–33.0)
MCHC: 34.1 g/dL (ref 31.5–35.7)
MCV: 91 fL (ref 79–97)
Monocytes Absolute: 0.5 10*3/uL (ref 0.1–0.9)
Monocytes: 6 %
Neutrophils Absolute: 5.8 10*3/uL (ref 1.4–7.0)
Neutrophils: 71 %
Platelets: 243 10*3/uL (ref 150–450)
RBC: 4.27 x10E6/uL (ref 3.77–5.28)
RDW: 12 % (ref 11.7–15.4)
RPR Ser Ql: NONREACTIVE
Rh Factor: POSITIVE
Rubella Antibodies, IGG: 5.75 index (ref >0.99)
WBC: 8.2 10*3/uL (ref 3.4–10.8)

## 2021-12-15 LAB — HCV INTERPRETATION

## 2021-12-16 LAB — CULTURE, OB URINE

## 2021-12-16 LAB — URINE CULTURE, OB REFLEX: Organism ID, Bacteria: NO GROWTH

## 2021-12-19 ENCOUNTER — Encounter: Payer: Self-pay | Admitting: Advanced Practice Midwife

## 2021-12-20 LAB — PANORAMA PRENATAL TEST FULL PANEL:PANORAMA TEST PLUS 5 ADDITIONAL MICRODELETIONS: FETAL FRACTION: 10.1

## 2021-12-21 LAB — HORIZON CUSTOM: REPORT SUMMARY: NEGATIVE

## 2021-12-30 ENCOUNTER — Encounter: Payer: Self-pay | Admitting: Advanced Practice Midwife

## 2022-01-01 DIAGNOSIS — F320 Major depressive disorder, single episode, mild: Secondary | ICD-10-CM | POA: Diagnosis not present

## 2022-01-10 ENCOUNTER — Ambulatory Visit (INDEPENDENT_AMBULATORY_CARE_PROVIDER_SITE_OTHER): Payer: Medicaid Other | Admitting: Obstetrics and Gynecology

## 2022-01-10 VITALS — BP 120/72 | HR 91 | Wt 117.0 lb

## 2022-01-10 DIAGNOSIS — Z3A15 15 weeks gestation of pregnancy: Secondary | ICD-10-CM

## 2022-01-10 DIAGNOSIS — Z3402 Encounter for supervision of normal first pregnancy, second trimester: Secondary | ICD-10-CM

## 2022-01-10 NOTE — Progress Notes (Signed)
   PRENATAL VISIT NOTE  Subjective:  Kathy Bates is a 21 y.o. G1P0000 at [redacted]w[redacted]d being seen today for ongoing prenatal care.  She is currently monitored for the following issues for this low-risk pregnancy and has Adolescent idiopathic scoliosis of thoracolumbar region; Attention deficit hyperactivity disorder (ADHD); Generalized anxiety disorder; and Encounter for supervision of normal first pregnancy in first trimester on their problem list.  Patient reports no complaints.  Contractions: Not present. Vag. Bleeding: None.  Movement: Present. Denies leaking of fluid.   The following portions of the patient's history were reviewed and updated as appropriate: allergies, current medications, past family history, past medical history, past social history, past surgical history and problem list.   Objective:   Vitals:   01/10/22 1453  BP: 120/72  Pulse: 91  Weight: 117 lb (53.1 kg)    Fetal Status: Fetal Heart Rate (bpm): 152   Movement: Present     General:  Alert, oriented and cooperative. Patient is in no acute distress.  Skin: Skin is warm and dry. No rash noted.   Cardiovascular: Normal heart rate noted  Respiratory: Normal respiratory effort, no problems with respiration noted  Abdomen: Soft, gravid, appropriate for gestational age.  Pain/Pressure: Absent     Pelvic: Cervical exam deferred        Extremities: Normal range of motion.  Edema: None  Mental Status: Normal mood and affect. Normal behavior. Normal judgment and thought content.   Assessment and Plan:  Pregnancy: G1P0000 at [redacted]w[redacted]d 1. [redacted] weeks gestation of pregnancy Routine care. Declines afp. F/u anatomy u/s  Preterm labor symptoms and general obstetric precautions including but not limited to vaginal bleeding, contractions, leaking of fluid and fetal movement were reviewed in detail with the patient. Please refer to After Visit Summary for other counseling recommendations.   No follow-ups on file.  Future  Appointments  Date Time Provider [place]  02/01/2022  2:30 PM [facility] US2 [facility]  02/07/2022  3:50 PM Donnamae Jude, MD [facility]  03/07/2022  2:30 PM Donnamae Jude, MD [facility]    Aletha Halim, MD

## 2022-01-10 NOTE — Progress Notes (Signed)
ROB [redacted]w[redacted]d   CC: shooting pain that is 5/10x in pelvic area.pt spotting after intercourse a few wks ago none today. Also notes "lighting crouch"

## 2022-01-15 DIAGNOSIS — F320 Major depressive disorder, single episode, mild: Secondary | ICD-10-CM | POA: Diagnosis not present

## 2022-01-23 ENCOUNTER — Encounter: Payer: Self-pay | Admitting: Advanced Practice Midwife

## 2022-01-29 DIAGNOSIS — F320 Major depressive disorder, single episode, mild: Secondary | ICD-10-CM | POA: Diagnosis not present

## 2022-02-01 ENCOUNTER — Ambulatory Visit: Payer: Medicaid Other | Attending: Advanced Practice Midwife

## 2022-02-01 ENCOUNTER — Other Ambulatory Visit: Payer: Self-pay | Admitting: Advanced Practice Midwife

## 2022-02-01 DIAGNOSIS — Z3401 Encounter for supervision of normal first pregnancy, first trimester: Secondary | ICD-10-CM | POA: Diagnosis not present

## 2022-02-01 DIAGNOSIS — Z363 Encounter for antenatal screening for malformations: Secondary | ICD-10-CM | POA: Insufficient documentation

## 2022-02-01 DIAGNOSIS — Z3A19 19 weeks gestation of pregnancy: Secondary | ICD-10-CM | POA: Diagnosis not present

## 2022-02-07 ENCOUNTER — Encounter: Payer: Self-pay | Admitting: Family Medicine

## 2022-02-07 ENCOUNTER — Encounter: Payer: Medicaid Other | Admitting: Family Medicine

## 2022-02-07 NOTE — Progress Notes (Signed)
Patient did not keep appointment today. She will be called to reschedule.  

## 2022-02-12 DIAGNOSIS — F320 Major depressive disorder, single episode, mild: Secondary | ICD-10-CM | POA: Diagnosis not present

## 2022-02-19 DIAGNOSIS — F320 Major depressive disorder, single episode, mild: Secondary | ICD-10-CM | POA: Diagnosis not present

## 2022-02-26 DIAGNOSIS — F320 Major depressive disorder, single episode, mild: Secondary | ICD-10-CM | POA: Diagnosis not present

## 2022-03-07 ENCOUNTER — Other Ambulatory Visit (HOSPITAL_COMMUNITY)
Admission: RE | Admit: 2022-03-07 | Discharge: 2022-03-07 | Disposition: A | Discharge status: Home / Self Care | Payer: Medicaid Other | Source: Ambulatory Visit | Attending: Family Medicine | Admitting: Family Medicine

## 2022-03-07 ENCOUNTER — Ambulatory Visit (INDEPENDENT_AMBULATORY_CARE_PROVIDER_SITE_OTHER): Payer: Medicaid Other | Admitting: Family Medicine

## 2022-03-07 VITALS — BP 111/68 | HR 83 | Wt 127.0 lb

## 2022-03-07 DIAGNOSIS — Z3400 Encounter for supervision of normal first pregnancy, unspecified trimester: Secondary | ICD-10-CM

## 2022-03-07 DIAGNOSIS — N898 Other specified noninflammatory disorders of vagina: Secondary | ICD-10-CM | POA: Insufficient documentation

## 2022-03-07 DIAGNOSIS — Z3402 Encounter for supervision of normal first pregnancy, second trimester: Secondary | ICD-10-CM

## 2022-03-07 DIAGNOSIS — Z3A23 23 weeks gestation of pregnancy: Secondary | ICD-10-CM

## 2022-03-07 MED ORDER — PRENATAL 27-0.8 MG PO TABS: 1.0000 | ORAL_TABLET | Freq: Every day | ORAL | 3 refills | Status: AC

## 2022-03-07 NOTE — Progress Notes (Signed)
ROB [redacted]w[redacted]d  Flu offered: Declined   Pt would like Rx for PNV's  CC: vaginal odor  Pt would like to do a self swab.   Pt has concerns with sleeping on back.

## 2022-03-07 NOTE — Progress Notes (Signed)
   PRENATAL VISIT NOTE  Subjective:  Kathy Bates is a 21 y.o. G1P0000 at [redacted]w[redacted]d being seen today for ongoing prenatal care.  She is currently monitored for the following issues for this low-risk pregnancy and has Adolescent idiopathic scoliosis of thoracolumbar region; Attention deficit hyperactivity disorder (ADHD); Generalized anxiety disorder; and Supervision of normal first pregnancy, antepartum on their problem list.  Patient reports  vaginal odor .  Contractions: Not present. Vag. Bleeding: None.  Movement: Present. Denies leaking of fluid.   The following portions of the patient's history were reviewed and updated as appropriate: allergies, current medications, past family history, past medical history, past social history, past surgical history and problem list.   Objective:   Vitals:   03/07/22 1439  BP: 111/68  Pulse: 83  Weight: 127 lb (57.6 kg)    Fetal Status: Fetal Heart Rate (bpm): 148   Movement: Present     General:  Alert, oriented and cooperative. Patient is in no acute distress.  Skin: Skin is warm and dry. No rash noted.   Cardiovascular: Normal heart rate noted  Respiratory: Normal respiratory effort, no problems with respiration noted  Abdomen: Soft, gravid, appropriate for gestational age.  Pain/Pressure: Absent     Pelvic: Cervical exam deferred        Extremities: Normal range of motion.  Edema: None  Mental Status: Normal mood and affect. Normal behavior. Normal judgment and thought content.   Assessment and Plan:  Pregnancy: G1P0000 at [redacted]w[redacted]d 1. Supervision of normal first pregnancy, antepartum Rx done for PNV Continue routine prenatal care. - Prenatal Vit-Fe Fumarate-FA (MULTIVITAMIN-PRENATAL) 27-0.8 MG TABS tablet; Take 1 tablet by mouth daily at 12 noon.  Dispense: 90 tablet; Refill: 3  2. Vaginal odor Self swab done - Cervicovaginal ancillary only( [place])   Preterm labor symptoms and general obstetric precautions including but not  limited to vaginal bleeding, contractions, leaking of fluid and fetal movement were reviewed in detail with the patient. Please refer to After Visit Summary for other counseling recommendations.   Return in about 4 weeks (around 04/04/2022) for [facility], 28 wk labs.  No future appointments.  Reva Bores, MD

## 2022-03-12 ENCOUNTER — Encounter: Payer: Self-pay | Admitting: Family Medicine

## 2022-03-12 DIAGNOSIS — F320 Major depressive disorder, single episode, mild: Secondary | ICD-10-CM | POA: Diagnosis not present

## 2022-03-12 LAB — CERVICOVAGINAL ANCILLARY ONLY
Bacterial Vaginitis (gardnerella): POSITIVE — AB
Candida Glabrata: NEGATIVE
Candida Vaginitis: NEGATIVE
Chlamydia: NEGATIVE
Comment: NEGATIVE
Comment: NEGATIVE
Comment: NEGATIVE
Comment: NEGATIVE
Comment: NEGATIVE
Comment: NORMAL
Neisseria Gonorrhea: NEGATIVE
Trichomonas: NEGATIVE

## 2022-03-12 MED ORDER — METRONIDAZOLE 500 MG PO TABS: 500.0000 mg | ORAL_TABLET | Freq: Two times a day (BID) | ORAL | 0 refills | Status: DC

## 2022-03-12 NOTE — Addendum Note (Signed)
Addended by: Reva Bores on: 03/12/2022 07:09 PM   Modules accepted: Orders

## 2022-03-22 ENCOUNTER — Ambulatory Visit (INDEPENDENT_AMBULATORY_CARE_PROVIDER_SITE_OTHER): Payer: Medicaid Other | Admitting: *Deleted

## 2022-03-22 DIAGNOSIS — Z3400 Encounter for supervision of normal first pregnancy, unspecified trimester: Secondary | ICD-10-CM | POA: Diagnosis not present

## 2022-03-22 NOTE — Progress Notes (Signed)
ATTESTATION OF SUPERVISION OF RN: Evaluation and management procedures were performed by the RN under my supervision and collaboration. I have reviewed the nursing note and chart and agree with the management and plan for this patient.  Oda Lansdowne, CNM  

## 2022-03-22 NOTE — Progress Notes (Signed)
Pt here for NST due to not feeling baby move since Tuesday evening. Has felt some cramping burt denies any vaginal bleeding.    NST reassuring   Scheryl Marten, RN

## 2022-03-26 DIAGNOSIS — F320 Major depressive disorder, single episode, mild: Secondary | ICD-10-CM | POA: Diagnosis not present

## 2022-04-03 ENCOUNTER — Telehealth: Payer: Self-pay | Admitting: Family Medicine

## 2022-04-03 NOTE — Telephone Encounter (Signed)
Pt called to reschedule got the after hours line I called the pt back and left a vmail

## 2022-04-04 ENCOUNTER — Encounter: Payer: Medicaid Other | Admitting: Family Medicine

## 2022-04-04 ENCOUNTER — Other Ambulatory Visit: Payer: Medicaid Other

## 2022-04-12 ENCOUNTER — Other Ambulatory Visit (HOSPITAL_COMMUNITY)
Admission: RE | Admit: 2022-04-12 | Discharge: 2022-04-12 | Disposition: A | Discharge status: Home / Self Care | Payer: Medicaid Other | Source: Ambulatory Visit | Attending: Family Medicine | Admitting: Family Medicine

## 2022-04-12 ENCOUNTER — Ambulatory Visit (INDEPENDENT_AMBULATORY_CARE_PROVIDER_SITE_OTHER): Payer: Medicaid Other | Admitting: Obstetrics and Gynecology

## 2022-04-12 ENCOUNTER — Other Ambulatory Visit: Payer: Medicaid Other

## 2022-04-12 VITALS — BP 109/74 | HR 80 | Wt 132.0 lb

## 2022-04-12 DIAGNOSIS — Z1332 Encounter for screening for maternal depression: Secondary | ICD-10-CM | POA: Diagnosis not present

## 2022-04-12 DIAGNOSIS — Z3403 Encounter for supervision of normal first pregnancy, third trimester: Secondary | ICD-10-CM | POA: Diagnosis not present

## 2022-04-12 DIAGNOSIS — Z3400 Encounter for supervision of normal first pregnancy, unspecified trimester: Secondary | ICD-10-CM

## 2022-04-12 DIAGNOSIS — N898 Other specified noninflammatory disorders of vagina: Secondary | ICD-10-CM | POA: Insufficient documentation

## 2022-04-12 DIAGNOSIS — Z3A29 29 weeks gestation of pregnancy: Secondary | ICD-10-CM

## 2022-04-12 NOTE — Progress Notes (Signed)
   PRENATAL VISIT NOTE  Subjective:  Kathy Bates is a 21 y.o. G1P0000 at [redacted]w[redacted]d being seen today for ongoing prenatal care.  She is currently monitored for the following issues for this low-risk pregnancy and has Adolescent idiopathic scoliosis of thoracolumbar region; Attention deficit hyperactivity disorder (ADHD); Generalized anxiety disorder; and Supervision of normal first pregnancy, antepartum on their problem list.  Patient reports no complaints.  Contractions: Not present. Vag. Bleeding: None.  Movement: Present. Denies leaking of fluid.   The following portions of the patient's history were reviewed and updated as appropriate: allergies, current medications, past family history, past medical history, past social history, past surgical history and problem list.   Objective:   Vitals:   04/12/22 0822  BP: 109/74  Pulse: 80  Weight: 132 lb (59.9 kg)    Fetal Status: Fetal Heart Rate (bpm): 150s   Movement: Present     General:  Alert, oriented and cooperative. Patient is in no acute distress.  Skin: Skin is warm and dry. No rash noted.   Cardiovascular: Normal heart rate noted  Respiratory: Normal respiratory effort, no problems with respiration noted  Abdomen: Soft, gravid, appropriate for gestational age.  Pain/Pressure: Absent     Pelvic: Cervical exam deferred        Extremities: Normal range of motion.  Edema: Trace  Mental Status: Normal mood and affect. Normal behavior. Normal judgment and thought content.   Assessment and Plan:  Pregnancy: G1P0000 at [redacted]w[redacted]d 1. Supervision of normal first pregnancy, antepartum Routine care. D/w her that her scoliosis shouldn't cause any labor issues and that anesthesia introduces themselves and assesses everyone regardless of if they are thinking they need regional anesthesia or not and can let her know if they think it would be an issues - Glucose Tolerance, 2 Hours w/1 Hour - CBC - RPR - HIV Antibody (routine testing w  rflx)  2. Vaginal discharge - Cervicovaginal ancillary only( [facility])  3. [redacted] weeks gestation of pregnancy  Preterm labor symptoms and general obstetric precautions including but not limited to vaginal bleeding, contractions, leaking of fluid and fetal movement were reviewed in detail with the patient. Please refer to After Visit Summary for other counseling recommendations.   Return in about 2 weeks (around 04/26/2022) for 2-3 weeks, md or app, in person or virtual, low risk ob.   [place] Bing, MD

## 2022-04-12 NOTE — Progress Notes (Signed)
ROB   CC:

## 2022-04-12 NOTE — Progress Notes (Signed)
ROB  2hr GTT Today Pt believes she may have bv wants another swab with  STD screening.

## 2022-04-13 LAB — CBC
Hematocrit: 36 % (ref 34.0–46.6)
Hemoglobin: 12.3 g/dL (ref 11.1–15.9)
MCH: 31.6 pg (ref 26.6–33.0)
MCHC: 34.2 g/dL (ref 31.5–35.7)
MCV: 93 fL (ref 79–97)
Platelets: 156 10*3/uL (ref 150–450)
RBC: 3.89 x10E6/uL (ref 3.77–5.28)
RDW: 12.5 % (ref 11.7–15.4)
WBC: 10.7 10*3/uL (ref 3.4–10.8)

## 2022-04-13 LAB — RPR: RPR Ser Ql: NONREACTIVE

## 2022-04-13 LAB — GLUCOSE TOLERANCE, 2 HOURS W/ 1HR
Glucose, 1 hour: 112 mg/dL (ref 70–179)
Glucose, 2 hour: 89 mg/dL (ref 70–152)
Glucose, Fasting: 77 mg/dL (ref 70–91)

## 2022-04-13 LAB — HIV ANTIBODY (ROUTINE TESTING W REFLEX): HIV Screen 4th Generation wRfx: NONREACTIVE

## 2022-04-17 LAB — CERVICOVAGINAL ANCILLARY ONLY
Bacterial Vaginitis (gardnerella): NEGATIVE
Candida Glabrata: NEGATIVE
Candida Vaginitis: NEGATIVE
Chlamydia: NEGATIVE
Comment: NEGATIVE
Comment: NEGATIVE
Comment: NEGATIVE
Comment: NEGATIVE
Comment: NEGATIVE
Comment: NORMAL
Neisseria Gonorrhea: NEGATIVE
Trichomonas: NEGATIVE

## 2022-04-18 ENCOUNTER — Encounter: Payer: Medicaid Other | Admitting: Family Medicine

## 2022-04-23 NOTE — L&D Delivery Note (Signed)
   Delivery Note:   G1P0000 at [redacted]w[redacted]d Admitting diagnosis: Indication for care in labor or delivery [O75.9] Risks: gTHN   First Stage:  Induction of labor:N/A  Onset of labor: 06/12/22 0300 Augmentation: N/A ROM: SROM @ 0300 on 06/12/22 Active labor onset: 06/12/22 @ 1236 Analgesia /Anesthesia/Pain control intrapartum: Epidural   Second Stage:  Complete dilation at 06/12/2022  @ 1627 Onset of pushing at 1656 FHR second stage Cat II    CNM called to patient bedside, patient crowning up to +4 station with maternal pushing efforts. Patient Pushing in lithotomy position with CNM and L&D staff support at bedside. Patients grandmother and FOB present for birth and supportive.   Delivery of a Live born female  Birth Weight:  PENDING  APGAR: 930 9  Newborn Delivery   Birth date/time: 06/12/2022 17:50:00 Delivery type: Vaginal, Spontaneous      With maternal pushing efforts fetal head delivered in cephalic presentation, position DOA and spontaneously restituted to ROT with ease. A compound hand presentation identified. CNM attempted to grasp presenting hand but was unsuccessful. With maternal pushing efforts, remaining fetal body delivered to abdomen and body cord identified over the left arm. Left arm easily freed and CNM encouraged patient to reach down and grab baby. Remaining fetal body delivered without difficulty and vigorously crying infant placed immediately S2S.  Nuchal Cord: No  Body cord over left arm.    After 7 mins of life cord double clamped after cessation of pulsation, and cut by Mother of the Baby.  Collection of cord blood for typing completed. Cord blood donation-None  Arterial cord blood sample-No    Third Stage:  With gentle cord traction and maternal pushing efforts Placenta delivered-Spontaneous  with 3 vessels . Uterine tone firm  bleeding minimal Uterotonics: IV pitocin bolus initiated.  Placenta to L&D for disposal. .  Bilateral Periurethral  laceration  identified.  Episiotomy:None  Local analgesia: n/a  Repair: Bilateral periurethral tears repaired in usual fashion with a 4.0 Monocryl and maintained hemostasis.  Est. Blood Loss ([id number]  Complications: None and Placental Abruption   Mom to postpartum.  Baby boy "Caiden" to Couplet care / Skin to Skin.  Delivery Report:  Review the Delivery Report for details.    Kiernan Farkas (Isaias Sakai PRollene Rotunda MSN, [place] 06/12/22 6:50 PM

## 2022-04-26 ENCOUNTER — Ambulatory Visit (INDEPENDENT_AMBULATORY_CARE_PROVIDER_SITE_OTHER): Payer: Self-pay | Admitting: Obstetrics and Gynecology

## 2022-04-26 VITALS — BP 114/77 | HR 80 | Wt 134.0 lb

## 2022-04-26 DIAGNOSIS — Z3A31 31 weeks gestation of pregnancy: Secondary | ICD-10-CM

## 2022-04-26 DIAGNOSIS — Z3403 Encounter for supervision of normal first pregnancy, third trimester: Secondary | ICD-10-CM

## 2022-04-26 NOTE — Progress Notes (Signed)
ROB   Pt wants to discuss water birth/ seeing a Mid -Wife.

## 2022-04-26 NOTE — Progress Notes (Signed)
   PRENATAL VISIT NOTE  Subjective:  Kathy Bates is a 22 y.o. G1P0000 at [redacted]w[redacted]d being seen today for ongoing prenatal care.  She is currently monitored for the following issues for this low-risk pregnancy and has Adolescent idiopathic scoliosis of thoracolumbar region; Attention deficit hyperactivity disorder (ADHD); Generalized anxiety disorder; and Supervision of normal first pregnancy, antepartum on their problem list.  Patient reports no complaints.  Contractions: Not present. Vag. Bleeding: None.  Movement: Present. Denies leaking of fluid.   The following portions of the patient's history were reviewed and updated as appropriate: allergies, current medications, past family history, past medical history, past social history, past surgical history and problem list.   Objective:   Vitals:   04/26/22 1508  BP: 114/77  Pulse: 80  Weight: 134 lb (60.8 kg)    Fetal Status: Fetal Heart Rate (bpm): 150 Fundal Height: 31 cm Movement: Present  Presentation: Vertex  General:  Alert, oriented and cooperative. Patient is in no acute distress.  Skin: Skin is warm and dry. No rash noted.   Cardiovascular: Normal heart rate noted  Respiratory: Normal respiratory effort, no problems with respiration noted  Abdomen: Soft, gravid, appropriate for gestational age.  Pain/Pressure: Present     Pelvic: Cervical exam deferred        Extremities: Normal range of motion.  Edema: Trace  Mental Status: Normal mood and affect. Normal behavior. Normal judgment and thought content.   Assessment and Plan:  Pregnancy: G1P0000 at [redacted]w[redacted]d Routine care. Normal 28wk labs. Interested in Microbiologist. Process, classes d/w pt  Preterm labor symptoms and general obstetric precautions including but not limited to vaginal bleeding, contractions, leaking of fluid and fetal movement were reviewed in detail with the patient. Please refer to After Visit Summary for other counseling recommendations.   Return in about 2  weeks (around 05/10/2022) for in person, low risk ob.  Future Appointments  Date Time Provider [place]  04/26/2022  3:30 PM Aletha Halim, MD [facility]    Aletha Halim, MD

## 2022-04-28 ENCOUNTER — Encounter: Payer: Self-pay | Admitting: Advanced Practice Midwife

## 2022-05-09 ENCOUNTER — Encounter: Payer: Self-pay | Admitting: Advanced Practice Midwife

## 2022-05-17 ENCOUNTER — Ambulatory Visit (INDEPENDENT_AMBULATORY_CARE_PROVIDER_SITE_OTHER): Payer: Medicaid Other | Admitting: Advanced Practice Midwife

## 2022-05-17 VITALS — BP 115/79 | HR 83 | Wt 136.0 lb

## 2022-05-17 DIAGNOSIS — Z3400 Encounter for supervision of normal first pregnancy, unspecified trimester: Secondary | ICD-10-CM

## 2022-05-17 DIAGNOSIS — Z3A34 34 weeks gestation of pregnancy: Secondary | ICD-10-CM

## 2022-05-17 DIAGNOSIS — Z3403 Encounter for supervision of normal first pregnancy, third trimester: Secondary | ICD-10-CM

## 2022-05-17 NOTE — Patient Instructions (Addendum)
  Considering Waterbirth? Guide for patients at [facility]) Why consider waterbirth? Gentle birth for babies  Less pain medicine used in labor  May allow for passive descent/less pushing  May reduce perineal tears  More mobility and instinctive maternal position changes  Increased maternal relaxation   Is waterbirth safe? What are the risks of infection, drowning or other complications? Infection:  Very low risk (3.7 % for tub vs 4.8% for bed)  7 in 8000 waterbirths with documented infection  Poorly cleaned equipment most common cause  Slightly lower group B strep transmission rate  Drowning  Maternal:  Very low risk  Related to seizures or fainting  Newborn:  Very low risk. No evidence of increased risk of respiratory problems in multiple large studies  Physiological protection from breathing under water  Avoid underwater birth if there are any fetal complications  Once baby's head is out of the water, keep it out.  Birth complication  Some reports of cord trauma, but risk decreased by bringing baby to surface gradually  No evidence of increased risk of shoulder dystocia. Mothers can usually change positions faster in water than in a bed, possibly aiding the maneuvers to free the shoulder.   There are 2 things you MUST do to have a waterbirth with [facility]: Attend a waterbirth class at [place] at [facility]   3rd Wednesday of every month from 7-9 pm (virtual during [place]) [employer] at [email or web address] or [email or web address] or by calling [phone] Bring [place] the certificate from the class to your prenatal appointment or send via [place] with a midwife at 36 weeks* to see if you can still plan a waterbirth and to sign the consent.   *We also recommend that you schedule as many of your prenatal visits with a midwife as possible.    Helpful information: You may want to bring a bathing suit top to the hospital  to wear during labor but this is optional.  All other supplies are provided by the hospital. Please arrive at the hospital with signs of active labor, and do not wait at home until late in labor. It takes 45 min- 1 hour for fetal monitoring, and check in to your room to take place, plus transport and filling of the waterbirth tub.    Things that would prevent you from having a waterbirth: Premature, <37wks  Previous cesarean birth  Presence of thick meconium-stained fluid  Multiple gestation (Twins, triplets, etc.)  Uncontrolled diabetes or gestational diabetes requiring medication  Hypertension diagnosed in pregnancy or preexisting hypertension (gestational hypertension, preeclampsia, or chronic hypertension) Fetal growth restriction (your baby measures less than 10th percentile on ultrasound) Heavy vaginal bleeding  Non-reassuring fetal heart rate  Active infection (MRSA, etc.). Group B Strep is NOT a contraindication for waterbirth.  If your labor has to be induced and induction method requires continuous monitoring of the baby's heart rate  Other risks/issues identified by your obstetrical provider   Please remember that birth is unpredictable. Under certain unforeseeable circumstances your provider may advise against giving birth in the tub. These decisions will be made on a case-by-case basis and with the safety of you and your baby as our highest priority.    Updated 07/26/21    [email or web address]

## 2022-05-18 NOTE — Progress Notes (Signed)
   PRENATAL VISIT NOTE  Subjective:  Kathy Bates is a 22 y.o. G1P0000 at [redacted]w[redacted]d being seen today for ongoing prenatal care.  She is currently monitored for the following issues for this low-risk pregnancy and has Adolescent idiopathic scoliosis of thoracolumbar region; Attention deficit hyperactivity disorder (ADHD); Generalized anxiety disorder; and Supervision of normal first pregnancy, antepartum on their problem list.  Patient reports no complaints.  Contractions: Irritability. Vag. Bleeding: None.  Movement: Present. Denies leaking of fluid.   The following portions of the patient's history were reviewed and updated as appropriate: allergies, current medications, past family history, past medical history, past social history, past surgical history and problem list. Problem list updated.  Objective:   Vitals:   05/17/22 1645  BP: 115/79  Pulse: 83  Weight: 136 lb (61.7 kg)    Fetal Status: Fetal Heart Rate (bpm): 163 Fundal Height: 34 cm Movement: Present     General:  Alert, oriented and cooperative. Patient is in no acute distress.  Skin: Skin is warm and dry. No rash noted.   Cardiovascular: Normal heart rate noted  Respiratory: Normal respiratory effort, no problems with respiration noted  Abdomen: Soft, gravid, appropriate for gestational age.  Pain/Pressure: Absent     Pelvic: Cervical exam deferred        Extremities: Normal range of motion.     Mental Status: Normal mood and affect. Normal behavior. Normal judgment and thought content.   Assessment and Plan:  Pregnancy: G1P0000 at [redacted]w[redacted]d  1. Supervision of normal first pregnancy, antepartum - Desires waterbirth! Needs to schedule class, can let me know if she has trouble registering - GBS and GC next visit - Great time to starting visualizing delivery space, support team, preferences, etc  2. [redacted] weeks gestation of pregnancy   Preterm labor symptoms and general obstetric precautions including but not limited to  vaginal bleeding, contractions, leaking of fluid and fetal movement were reviewed in detail with the patient. Please refer to After Visit Summary for other counseling recommendations.  Return in about 1 week (around 05/24/2022).  Future Appointments  Date Time Provider [place]  05/31/2022  4:10 PM Darlina Rumpf, [place] [facility]  06/06/2022  3:30 PM Donnamae Jude, MD [facility]  06/14/2022  3:50 PM Darlina Rumpf, CNM [facility]  06/21/2022  4:10 PM Aletha Halim, MD [facility]    Darlina Rumpf, CNM

## 2022-05-31 ENCOUNTER — Ambulatory Visit (INDEPENDENT_AMBULATORY_CARE_PROVIDER_SITE_OTHER): Payer: Medicaid Other | Admitting: Advanced Practice Midwife

## 2022-05-31 ENCOUNTER — Other Ambulatory Visit (HOSPITAL_COMMUNITY)
Admission: RE | Admit: 2022-05-31 | Discharge: 2022-05-31 | Disposition: A | Discharge status: Home / Self Care | Payer: Medicaid Other | Source: Ambulatory Visit | Attending: Advanced Practice Midwife | Admitting: Advanced Practice Midwife

## 2022-05-31 VITALS — BP 125/82 | HR 76 | Wt 144.0 lb

## 2022-05-31 DIAGNOSIS — Z3403 Encounter for supervision of normal first pregnancy, third trimester: Secondary | ICD-10-CM

## 2022-05-31 DIAGNOSIS — Z3400 Encounter for supervision of normal first pregnancy, unspecified trimester: Secondary | ICD-10-CM

## 2022-05-31 DIAGNOSIS — Z3A36 36 weeks gestation of pregnancy: Secondary | ICD-10-CM | POA: Diagnosis not present

## 2022-05-31 DIAGNOSIS — Z3483 Encounter for supervision of other normal pregnancy, third trimester: Secondary | ICD-10-CM | POA: Insufficient documentation

## 2022-05-31 DIAGNOSIS — Z3493 Encounter for supervision of normal pregnancy, unspecified, third trimester: Secondary | ICD-10-CM | POA: Diagnosis not present

## 2022-05-31 LAB — OB RESULTS CONSOLE GC/CHLAMYDIA
Chlamydia: NEGATIVE
Neisseria Gonorrhea: NEGATIVE

## 2022-05-31 LAB — OB RESULTS CONSOLE GBS: GBS: NEGATIVE

## 2022-05-31 NOTE — Progress Notes (Signed)
ROB   CC: Cramps and back pain  , notes carpel tunnel  Pt would like cervix check today.

## 2022-06-01 NOTE — Progress Notes (Signed)
   PRENATAL VISIT NOTE  Subjective:  Kathy Bates is a 22 y.o. G1P0000 at [redacted]w[redacted]d being seen today for ongoing prenatal care.  She is currently monitored for the following issues for this low-risk pregnancy and has Adolescent idiopathic scoliosis of thoracolumbar region; Attention deficit hyperactivity disorder (ADHD); Generalized anxiety disorder; and Supervision of normal first pregnancy, antepartum on their problem list.  Patient reports  bilateral swelling in her lower extremities. Improves with rest and elevation, does not completely resolve .  Contractions: Irritability. Vag. Bleeding: None.  Movement: Present. Denies leaking of fluid.   The following portions of the patient's history were reviewed and updated as appropriate: allergies, current medications, past family history, past medical history, past social history, past surgical history and problem list. Problem list updated.  Objective:   Vitals:   05/31/22 1621  BP: 125/82  Pulse: 76  Weight: 144 lb (65.3 kg)    Fetal Status: Fetal Heart Rate (bpm): 158 Fundal Height: 35 cm Movement: Present     General:  Alert, oriented and cooperative. Patient is in no acute distress.  Skin: Skin is warm and dry. No rash noted.   Cardiovascular: Normal heart rate noted  Respiratory: Normal respiratory effort, no problems with respiration noted  Abdomen: Soft, gravid, appropriate for gestational age.  Pain/Pressure: Present     Pelvic: Cervical exam performed per patient request Dilation: 1 Effacement (%): Thick Station: -3  Extremities: Normal range of motion.  Edema: Mild pitting, slight indentation  Mental Status: Normal mood and affect. Normal behavior. Normal judgment and thought content.   Assessment and Plan:  Pregnancy: G1P0000 at [redacted]w[redacted]d  1. Supervision of normal first pregnancy, antepartum - Planning waterbirth, unable to attend class thus far - Will see if she can reschedule, CNM can reach out to Melvia Heaps  PRN --Doristine Devoid time to start crafting a general, flexible birth plan, bring questions to visits  2. [redacted] weeks gestation of pregnancy - Preemptive discussion, impact of GBS positive result on labor course - Strep Gp B NAA - Cervicovaginal ancillary only( [place])  Preterm labor symptoms and general obstetric precautions including but not limited to vaginal bleeding, contractions, leaking of fluid and fetal movement were reviewed in detail with the patient. Please refer to After Visit Summary for other counseling recommendations.  Return in about 1 week (around 06/07/2022) for MD or APP.  Future Appointments  Date Time Provider [place]  06/06/2022  3:30 PM Donnamae Jude, MD [facility]  06/14/2022  3:50 PM Darlina Rumpf, CNM [facility]  06/21/2022  4:10 PM Aletha Halim, MD [facility]    Darlina Rumpf, [place]

## 2022-06-02 ENCOUNTER — Encounter: Payer: Self-pay | Admitting: Advanced Practice Midwife

## 2022-06-02 LAB — STREP GP B NAA: Strep Gp B NAA: NEGATIVE

## 2022-06-04 LAB — CERVICOVAGINAL ANCILLARY ONLY
Chlamydia: NEGATIVE
Comment: NEGATIVE
Comment: NORMAL
Neisseria Gonorrhea: NEGATIVE

## 2022-06-06 ENCOUNTER — Encounter: Payer: Medicaid Other | Admitting: Family Medicine

## 2022-06-06 ENCOUNTER — Encounter: Payer: Self-pay | Admitting: Family Medicine

## 2022-06-06 NOTE — Progress Notes (Signed)
Patient did not keep appointment today. She will be called to reschedule.  

## 2022-06-08 ENCOUNTER — Encounter: Payer: Self-pay | Admitting: Obstetrics and Gynecology

## 2022-06-08 ENCOUNTER — Ambulatory Visit (INDEPENDENT_AMBULATORY_CARE_PROVIDER_SITE_OTHER): Payer: Medicaid Other | Admitting: Obstetrics and Gynecology

## 2022-06-08 VITALS — BP 125/86 | HR 73 | Wt 146.0 lb

## 2022-06-08 DIAGNOSIS — Z3483 Encounter for supervision of other normal pregnancy, third trimester: Secondary | ICD-10-CM

## 2022-06-08 DIAGNOSIS — Z3A37 37 weeks gestation of pregnancy: Secondary | ICD-10-CM

## 2022-06-08 NOTE — Progress Notes (Signed)
   PRENATAL VISIT NOTE  Subjective:  Kathy Bates is a 22 y.o. G1P0000 at 87w1dbeing seen today for ongoing prenatal care.  She is currently monitored for the following issues for this low-risk pregnancy and has Adolescent idiopathic scoliosis of thoracolumbar region; Attention deficit hyperactivity disorder (ADHD); Generalized anxiety disorder; and Supervision of normal first pregnancy, antepartum on their problem list.  Patient reports  b/l LE edema and CTS s/s. .Marland Kitchen Contractions: Irritability. Vag. Bleeding: Small.  Movement: Present. Denies leaking of fluid.   The following portions of the patient's history were reviewed and updated as appropriate: allergies, current medications, past family history, past medical history, past social history, past surgical history and problem list.   Objective:   Vitals:   06/08/22 1013  BP: 125/86  Pulse: 73  Weight: 146 lb (66.2 kg)    Fetal Status: Fetal Heart Rate (bpm): 146 Fundal Height: 36 cm Movement: Present  Presentation: Vertex  General:  Alert, oriented and cooperative. Patient is in no acute distress.  Skin: Skin is warm and dry. No rash noted.   Cardiovascular: Normal heart rate noted  Respiratory: Normal respiratory effort, no problems with respiration noted  Abdomen: Soft, gravid, appropriate for gestational age.  Pain/Pressure: Present     Pelvic: Cervical exam performed in the presence of a chaperone Dilation: 1 Effacement (%): 50 Station: -2  Extremities: Normal range of motion.  Edema: Deep pitting, indentation remains for a short time  Mental Status: Normal mood and affect. Normal behavior. Normal judgment and thought content.   Assessment and Plan:  Pregnancy: G1P0000 at 319w1d. [redacted] weeks gestation of pregnancy TED hoses/compressions stockings d/w her GBS neg Depo provera  Term labor symptoms and general obstetric precautions including but not limited to vaginal bleeding, contractions, leaking of fluid and fetal  movement were reviewed in detail with the patient. Please refer to After Visit Summary for other counseling recommendations.   Return in about 1 week (around 06/15/2022) for in person, low risk ob, md or app.  Future Appointments  Date Time Provider [place]2/22/2024  3:50 PM WeKieth Bright[facility]  06/21/2022  4:10 PM PiAletha HalimMD [facility]    ChAletha HalimMD

## 2022-06-08 NOTE — Patient Instructions (Signed)
TED hoses and/or compression stockings

## 2022-06-08 NOTE — Progress Notes (Signed)
ROB   Pt would like cervix check.   CC: Swelling and carpal tunnel

## 2022-06-12 ENCOUNTER — Inpatient Hospital Stay (HOSPITAL_COMMUNITY)
Admission: AD | Admit: 2022-06-12 | Discharge: 2022-06-15 | DRG: 807 | Disposition: A | Discharge status: Home / Self Care | Payer: Medicaid Other | Attending: Family Medicine | Admitting: Family Medicine

## 2022-06-12 ENCOUNTER — Inpatient Hospital Stay (HOSPITAL_COMMUNITY): Payer: Medicaid Other | Admitting: Anesthesiology

## 2022-06-12 ENCOUNTER — Encounter (HOSPITAL_COMMUNITY): Payer: Self-pay | Admitting: Family Medicine

## 2022-06-12 DIAGNOSIS — Z3A37 37 weeks gestation of pregnancy: Secondary | ICD-10-CM | POA: Diagnosis not present

## 2022-06-12 DIAGNOSIS — O99344 Other mental disorders complicating childbirth: Secondary | ICD-10-CM | POA: Diagnosis not present

## 2022-06-12 DIAGNOSIS — F411 Generalized anxiety disorder: Secondary | ICD-10-CM | POA: Diagnosis not present

## 2022-06-12 DIAGNOSIS — O4593 Premature separation of placenta, unspecified, third trimester: Principal | ICD-10-CM | POA: Diagnosis present

## 2022-06-12 DIAGNOSIS — O4202 Full-term premature rupture of membranes, onset of labor within 24 hours of rupture: Secondary | ICD-10-CM | POA: Diagnosis not present

## 2022-06-12 DIAGNOSIS — O26893 Other specified pregnancy related conditions, third trimester: Secondary | ICD-10-CM | POA: Diagnosis not present

## 2022-06-12 DIAGNOSIS — O322XX0 Maternal care for transverse and oblique lie, not applicable or unspecified: Secondary | ICD-10-CM | POA: Diagnosis not present

## 2022-06-12 DIAGNOSIS — D75839 Thrombocytosis, unspecified: Secondary | ICD-10-CM

## 2022-06-12 DIAGNOSIS — O9912 Other diseases of the blood and blood-forming organs and certain disorders involving the immune mechanism complicating childbirth: Secondary | ICD-10-CM | POA: Diagnosis not present

## 2022-06-12 DIAGNOSIS — O134 Gestational [pregnancy-induced] hypertension without significant proteinuria, complicating childbirth: Secondary | ICD-10-CM | POA: Diagnosis present

## 2022-06-12 DIAGNOSIS — Z3400 Encounter for supervision of normal first pregnancy, unspecified trimester: Secondary | ICD-10-CM

## 2022-06-12 HISTORY — DX: Thrombocytosis, unspecified: D75.839

## 2022-06-12 LAB — COMPREHENSIVE METABOLIC PANEL
ALT: 11 U/L (ref 0–44)
AST: 26 U/L (ref 15–41)
Albumin: 2.8 g/dL — ABNORMAL LOW (ref 3.5–5.0)
Alkaline Phosphatase: 202 U/L — ABNORMAL HIGH (ref 38–126)
Anion gap: 13 (ref 5–15)
BUN: 5 mg/dL — ABNORMAL LOW (ref 6–20)
CO2: 20 mmol/L — ABNORMAL LOW (ref 22–32)
Calcium: 9.2 mg/dL (ref 8.9–10.3)
Chloride: 102 mmol/L (ref 98–111)
Creatinine, Ser: 0.51 mg/dL (ref 0.44–1.00)
GFR, Estimated: >60 mL/min (ref >60)
Glucose, Bld: 79 mg/dL (ref 70–99)
Potassium: 3.2 mmol/L — ABNORMAL LOW (ref 3.5–5.1)
Sodium: 135 mmol/L (ref 135–145)
Total Bilirubin: 0.6 mg/dL (ref 0.3–1.2)
Total Protein: 5.7 g/dL — ABNORMAL LOW (ref 6.5–8.1)

## 2022-06-12 LAB — CBC
HCT: 34.5 % — ABNORMAL LOW (ref 36.0–46.0)
Hemoglobin: 12.5 g/dL (ref 12.0–15.0)
MCH: 31.9 pg (ref 26.0–34.0)
MCHC: 36.2 g/dL — ABNORMAL HIGH (ref 30.0–36.0)
MCV: 88 fL (ref 80.0–100.0)
Platelets: 122 10*3/uL — ABNORMAL LOW (ref 150–400)
RBC: 3.92 MIL/uL (ref 3.87–5.11)
RDW: 12.8 % (ref 11.5–15.5)
WBC: 12.3 10*3/uL — ABNORMAL HIGH (ref 4.0–10.5)
nRBC: 0 % (ref 0.0–0.2)

## 2022-06-12 LAB — OB RESULTS CONSOLE RPR: RPR: NONREACTIVE

## 2022-06-12 LAB — RPR: RPR Ser Ql: NONREACTIVE

## 2022-06-12 LAB — PROTEIN / CREATININE RATIO, URINE
Creatinine, Urine: 120 mg/dL
Protein Creatinine Ratio: 0.15 mg/mg{Cre} (ref 0.00–0.15)
Total Protein, Urine: 18 mg/dL

## 2022-06-12 LAB — POCT FERN TEST: POCT Fern Test: POSITIVE

## 2022-06-12 LAB — HIV ANTIBODY (ROUTINE TESTING W REFLEX): HIV Screen 4th Generation wRfx: NONREACTIVE

## 2022-06-12 MED ORDER — LIDOCAINE HCL (PF) 1 % IJ SOLN: 30.0000 mL | INTRAMUSCULAR | Status: DC | PRN

## 2022-06-12 MED ORDER — OXYCODONE-ACETAMINOPHEN 5-325 MG PO TABS: 2.0000 | ORAL_TABLET | ORAL | Status: DC | PRN

## 2022-06-12 MED ORDER — ONDANSETRON HCL 4 MG/2ML IJ SOLN: 4.0000 mg | INTRAMUSCULAR | Status: DC | PRN

## 2022-06-12 MED ORDER — FENTANYL-BUPIVACAINE-NACL 0.5-0.125-0.9 MG/250ML-% EP SOLN
EPIDURAL | Status: AC
  Filled 2022-06-12: qty 250

## 2022-06-12 MED ORDER — ACETAMINOPHEN 325 MG PO TABS
650.0000 mg | ORAL_TABLET | ORAL | Status: DC | PRN
  Administered 2022-06-14 – 2022-06-15 (×4): 650 mg via ORAL
  Filled 2022-06-12 (×4): qty 2

## 2022-06-12 MED ORDER — FENTANYL CITRATE (PF) 100 MCG/2ML IJ SOLN
100.0000 ug | INTRAMUSCULAR | Status: DC | PRN
  Administered 2022-06-12 (×2): 100 ug via INTRAVENOUS
  Filled 2022-06-12 (×2): qty 2

## 2022-06-12 MED ORDER — ZOLPIDEM TARTRATE 5 MG PO TABS: 5.0000 mg | ORAL_TABLET | Freq: Every evening | ORAL | Status: DC | PRN

## 2022-06-12 MED ORDER — BENZOCAINE-MENTHOL 20-0.5 % EX AERO
1.0000 | INHALATION_SPRAY | CUTANEOUS | Status: DC | PRN
  Administered 2022-06-14: 1 via TOPICAL
  Filled 2022-06-12 (×2): qty 56

## 2022-06-12 MED ORDER — DIPHENHYDRAMINE HCL 25 MG PO CAPS: 25.0000 mg | ORAL_CAPSULE | Freq: Four times a day (QID) | ORAL | Status: DC | PRN

## 2022-06-12 MED ORDER — WITCH HAZEL-GLYCERIN EX PADS: 1.0000 | MEDICATED_PAD | CUTANEOUS | Status: DC | PRN

## 2022-06-12 MED ORDER — LIDOCAINE HCL (PF) 1 % IJ SOLN
INTRAMUSCULAR | Status: DC | PRN
  Administered 2022-06-12: 11 mL via EPIDURAL

## 2022-06-12 MED ORDER — PHENYLEPHRINE 80 MCG/ML (10ML) SYRINGE FOR IV PUSH (FOR BLOOD PRESSURE SUPPORT): 80.0000 ug | PREFILLED_SYRINGE | INTRAVENOUS | Status: DC | PRN

## 2022-06-12 MED ORDER — TETANUS-DIPHTH-ACELL PERTUSSIS 5-2.5-18.5 LF-MCG/0.5 IM SUSY: 0.5000 mL | PREFILLED_SYRINGE | Freq: Once | INTRAMUSCULAR | Status: DC

## 2022-06-12 MED ORDER — OXYTOCIN BOLUS FROM INFUSION
333.0000 mL | Freq: Once | INTRAVENOUS | Status: AC
  Administered 2022-06-12: 333 mL via INTRAVENOUS

## 2022-06-12 MED ORDER — LACTATED RINGERS IV SOLN: 500.0000 mL | INTRAVENOUS | Status: DC | PRN

## 2022-06-12 MED ORDER — OXYTOCIN-SODIUM CHLORIDE 30-0.9 UT/500ML-% IV SOLN
2.5000 [IU]/h | INTRAVENOUS | Status: DC
  Filled 2022-06-12: qty 500

## 2022-06-12 MED ORDER — ONDANSETRON HCL 4 MG PO TABS: 4.0000 mg | ORAL_TABLET | ORAL | Status: DC | PRN

## 2022-06-12 MED ORDER — FLEET ENEMA 7-19 GM/118ML RE ENEM: 1.0000 | ENEMA | RECTAL | Status: DC | PRN

## 2022-06-12 MED ORDER — LACTATED RINGERS IV SOLN: INTRAVENOUS | Status: DC

## 2022-06-12 MED ORDER — PRENATAL MULTIVITAMIN CH
1.0000 | ORAL_TABLET | Freq: Every day | ORAL | Status: DC
  Administered 2022-06-13 – 2022-06-15 (×3): 1 via ORAL
  Filled 2022-06-12 (×3): qty 1

## 2022-06-12 MED ORDER — IBUPROFEN 600 MG PO TABS
600.0000 mg | ORAL_TABLET | Freq: Four times a day (QID) | ORAL | Status: DC
  Administered 2022-06-12 – 2022-06-15 (×10): 600 mg via ORAL
  Filled 2022-06-12 (×9): qty 1

## 2022-06-12 MED ORDER — SIMETHICONE 80 MG PO CHEW: 80.0000 mg | CHEWABLE_TABLET | ORAL | Status: DC | PRN

## 2022-06-12 MED ORDER — LACTATED RINGERS IV SOLN: 500.0000 mL | Freq: Once | INTRAVENOUS | Status: DC

## 2022-06-12 MED ORDER — SOD CITRATE-CITRIC ACID 500-334 MG/5ML PO SOLN: 30.0000 mL | ORAL | Status: DC | PRN

## 2022-06-12 MED ORDER — OXYCODONE HCL 5 MG PO TABS: 10.0000 mg | ORAL_TABLET | ORAL | Status: DC | PRN

## 2022-06-12 MED ORDER — OXYCODONE-ACETAMINOPHEN 5-325 MG PO TABS: 1.0000 | ORAL_TABLET | ORAL | Status: DC | PRN

## 2022-06-12 MED ORDER — OXYCODONE HCL 5 MG PO TABS: 5.0000 mg | ORAL_TABLET | ORAL | Status: DC | PRN

## 2022-06-12 MED ORDER — EPHEDRINE 5 MG/ML INJ: 10.0000 mg | INTRAVENOUS | Status: DC | PRN

## 2022-06-12 MED ORDER — DIPHENHYDRAMINE HCL 50 MG/ML IJ SOLN: 12.5000 mg | INTRAMUSCULAR | Status: DC | PRN

## 2022-06-12 MED ORDER — HYDROXYZINE HCL 50 MG PO TABS: 50.0000 mg | ORAL_TABLET | Freq: Four times a day (QID) | ORAL | Status: DC | PRN

## 2022-06-12 MED ORDER — ACETAMINOPHEN 325 MG PO TABS: 650.0000 mg | ORAL_TABLET | ORAL | Status: DC | PRN

## 2022-06-12 MED ORDER — FENTANYL-BUPIVACAINE-NACL 0.5-0.125-0.9 MG/250ML-% EP SOLN
12.0000 mL/h | EPIDURAL | Status: DC | PRN
  Administered 2022-06-12: 12 mL/h via EPIDURAL

## 2022-06-12 MED ORDER — SENNOSIDES-DOCUSATE SODIUM 8.6-50 MG PO TABS
2.0000 | ORAL_TABLET | Freq: Every day | ORAL | Status: DC
  Administered 2022-06-13: 2 via ORAL
  Filled 2022-06-12: qty 2

## 2022-06-12 MED ORDER — DIBUCAINE (PERIANAL) 1 % EX OINT: 1.0000 | TOPICAL_OINTMENT | CUTANEOUS | Status: DC | PRN

## 2022-06-12 MED ORDER — COCONUT OIL OIL: 1.0000 | TOPICAL_OIL | Status: DC | PRN

## 2022-06-12 MED ORDER — ONDANSETRON HCL 4 MG/2ML IJ SOLN: 4.0000 mg | Freq: Four times a day (QID) | INTRAMUSCULAR | Status: DC | PRN

## 2022-06-12 NOTE — Anesthesia Preprocedure Evaluation (Signed)
Anesthesia Evaluation  Patient identified by MRN, date of birth, ID band Patient awake    Reviewed: Allergy & Precautions, H&P , NPO status , Patient's Chart, lab work & pertinent test results  Airway Mallampati: II  TM Distance: >3 FB Neck ROM: Full    Dental no notable dental hx.    Pulmonary neg pulmonary ROS   Pulmonary exam normal breath sounds clear to auscultation       Cardiovascular negative cardio ROS Normal cardiovascular exam Rhythm:Regular Rate:Normal     Neuro/Psych negative neurological ROS  negative psych ROS   GI/Hepatic negative GI ROS, Neg liver ROS,,,  Endo/Other  negative endocrine ROS    Renal/GU negative Renal ROS  negative genitourinary   Musculoskeletal negative musculoskeletal ROS (+)    Abdominal   Peds negative pediatric ROS (+)  Hematology negative hematology ROS (+)   Anesthesia Other Findings   Reproductive/Obstetrics (+) Pregnancy                             Anesthesia Physical Anesthesia Plan  ASA: 2  Anesthesia Plan: Epidural   Post-op Pain Management:    Induction:   PONV Risk Score and Plan:   Airway Management Planned:   Additional Equipment:   Intra-op Plan:   Post-operative Plan:   Informed Consent:   Plan Discussed with:   Anesthesia Plan Comments:        Anesthesia Quick Evaluation

## 2022-06-12 NOTE — H&P (Signed)
Kathy Bates is a 22 y.o. female presenting for SROM at 12w5dat 0300 am this morning. Patient reports clear fluid. And Good Fetal Movement. She reports contractions every 3-5 mins and currently rating them a 10/10. Patient is interested in WB. Upon arrival to MAU patient is having elevated BPs unknown if pain related or Gestational HTN. Patient denies problems with BPs prior to this.   OB History     Gravida  1   Para  0   Term  0   Preterm  0   AB  0   Living  0      SAB  0   IAB  0   Ectopic  0   Multiple  0   Live Births             Past Medical History:  Diagnosis Date   ADHD (attention deficit hyperactivity disorder)    Moderate episode of recurrent major depressive disorder ([place] 08/23/2021   Parent-child relational problem 10/18/2015   History reviewed. No pertinent surgical history. Family History: family history includes Diabetes in her father; Hypertension in her father. Social History:  reports that she has never smoked. She has never used smokeless tobacco. She reports current alcohol use. She reports that she does not use drugs.     Maternal Diabetes: No Genetic Screening: Normal Maternal Ultrasounds/Referrals: Normal Fetal Ultrasounds or other Referrals:  None Maternal Substance Abuse:  No Significant Maternal Medications:  None Significant Maternal Lab Results:  Group B Strep negative Number of Prenatal Visits:greater than 3 verified prenatal visits Other Comments:   Last [place]@ 280w0detus in cephalic presentation, with an anterior placenta. AFI WNL. EFW at that time was 0# 11oz 88%tile.    Review of Systems  Constitutional:  Negative for chills, fatigue and fever.  Eyes:  Negative for pain and visual disturbance.  Respiratory:  Negative for apnea, shortness of breath and wheezing.   Cardiovascular:  Negative for chest pain and palpitations.  Gastrointestinal:  Negative for abdominal pain, constipation, diarrhea, nausea and vomiting.   Genitourinary:  Negative for difficulty urinating, dysuria, pelvic pain, vaginal bleeding, vaginal discharge and vaginal pain.  Musculoskeletal:  Negative for back pain.  Neurological:  Negative for seizures, weakness and headaches.  Psychiatric/Behavioral:  Negative for suicidal ideas.    Maternal Medical History:  Reason for admission: Rupture of membranes.  Nausea.  Contractions: Onset was 3-5 hours ago.   Fetal activity: Perceived fetal activity is normal.     Dilation: 1.5 Effacement (%): 70 Station: -1 Exam by:: DCallaway, RN Blood pressure (!) 140/98, pulse 78, temperature 98 F (36.7 C), temperature source Oral, resp. rate 18, height '5\' 2"'$  (1.575 m), weight 66.9 kg, last menstrual period 08/21/2021, SpO2 91 %. Maternal Exam:  Uterine Assessment: Contraction strength is mild.  Contraction frequency is regular.  Abdomen: Patient reports no abdominal tenderness. Fetal presentation: vertex Cervix: Cervix evaluated by digital exam.     Physical Exam Vitals and nursing note reviewed.  Constitutional:      General: She is not in acute distress.    Appearance: Normal appearance.  HENT:     Head: Normocephalic.  Pulmonary:     Effort: Pulmonary effort is normal.  Musculoskeletal:     Cervical back: Normal range of motion.  Skin:    General: Skin is warm and dry.     Capillary Refill: Capillary refill takes less than 2 seconds.  Neurological:     Mental Status: She is alert and  oriented to person, place, and time.  Psychiatric:        Mood and Affect: Mood normal.     Prenatal labs: ABO, Rh: AB/Positive/-- (08/24 1450) Antibody: Negative (08/24 1450) Rubella: 5.75 (08/24 1450) RPR: Non Reactive (12/21 0856)  HBsAg: Negative (08/24 1450)  HIV: Non Reactive (02/20 0700)  GBS: Negative/-- (02/08 1628)  Patient Vitals for the past 24 hrs:  BP Temp Temp src Pulse Resp SpO2 Height Weight  06/12/22 0926 (!) 140/98 98 F (36.7 C) Oral 78 18 -- -- --  06/12/22 0855 --  -- -- -- -- 91 % -- --  06/12/22 0759 (!) 141/94 -- -- 82 -- -- -- --  06/12/22 0641 (!) 144/94 -- -- 73 -- -- -- --  06/12/22 0623 (!) 130/92 97.6 F (36.4 C) Oral 71 16 -- '5\' 2"'$  (1.575 m) 66.9 kg     Assessment/Plan: - Admit to L&D for SROM. Clear Fluid -  Expectant Management  - Elevated BPs in L&D. Baseline PreE labs collected and in process. Unsure at this time if pain related or gHTN.   - Patient counseled on risking out of WB if BPs continue to remain elevated. Patient verbalized understanding.  - FWB: Cat I 135bpm 15x15 accels no decels. Toco Q 2-41mns.  - GBS Negative  - Anticipated MOD: NSVD   SJacquiline Doe MSN CNM  06/12/2022, 10:02 AM

## 2022-06-12 NOTE — Discharge Summary (Signed)
Postpartum Discharge Summary    Patient Name: Kathy Bates DOB: Mar 21, 2001 MRN: [id number]  Date of admission: 06/12/2022 Delivery date:06/12/2022  Delivering provider: Deloris Ping  Date of discharge: 06/14/2022  Admitting diagnosis: Indication for care in labor or delivery [O75.9] Intrauterine pregnancy: [redacted]w[redacted]d    Secondary diagnosis:  Principal Problem:   Vaginal delivery Active Problems:   Generalized anxiety disorder   Supervision of normal first pregnancy, antepartum   Thrombocythemia  Additional problems: n/a    Discharge diagnosis: Term Pregnancy Delivered                                              Post partum procedures: n/a Augmentation: N/A Complications: None  Hospital course: Onset of Labor With Vaginal Delivery      22y.o. yo G1P0000 at 378w5das admitted in Latent Labor on 06/12/2022. Labor course was uncomplicated.   Membrane Rupture Time/Date: 3:00 AM ,06/12/2022   Delivery Method:Vaginal, Spontaneous  Episiotomy: None  Lacerations:  Periurethral  Patient had a postpartum course complicated by gHTN.  She is ambulating, tolerating a regular diet, passing flatus, and urinating well. Patient is discharged home in stable condition on 06/14/22.  Newborn Data: Birth date:06/12/2022  Birth time:5:50 PM  Gender:Female  Living status:Living  Apgars:9 ,9  Weight:3320 g   Magnesium Sulfate received: No BMZ received: No Rhophylac:N/A MMR:N/A T-DaP: not done Flu: No Transfusion:No  Physical exam  Vitals:   06/13/22 0852 06/13/22 1432 06/13/22 2157 06/14/22 0511  BP: 122/88 128/83 (!) 132/94 137/84  Pulse: 74 83 82 78  Resp:  '18 18 18  '$ Temp: 98 F (36.7 C) 98.2 F (36.8 C) 98 F (36.7 C) 98.7 F (37.1 C)  TempSrc: Oral Oral Oral Oral  SpO2:    99%  Weight:      Height:       General: alert, cooperative, and no distress Lochia: appropriate Uterine Fundus: firm Incision: N/A DVT Evaluation: No evidence of DVT seen on physical  exam. Labs: Lab Results  Component Value Date   WBC 14.8 (H) 06/13/2022   HGB 10.4 (L) 06/13/2022   HCT 30.1 (L) 06/13/2022   MCV 91.8 06/13/2022   PLT 112 (L) 06/13/2022      Latest Ref Rng & Units 06/12/2022    7:00 AM  CMP  Glucose 70 - 99 mg/dL 79   BUN 6 - 20 mg/dL 5   Creatinine 0.44 - 1.00 mg/dL 0.51   Sodium 135 - 145 mmol/L 135   Potassium 3.5 - 5.1 mmol/L 3.2   Chloride 98 - 111 mmol/L 102   CO2 22 - 32 mmol/L 20   Calcium 8.9 - 10.3 mg/dL 9.2   Total Protein 6.5 - 8.1 g/dL 5.7   Total Bilirubin 0.3 - 1.2 mg/dL 0.6   Alkaline Phos 38 - 126 U/L 202   AST 15 - 41 U/L 26   ALT 0 - 44 U/L 11    Edinburgh Score:     No data to display           After visit meds:  Allergies as of 06/14/2022       Reactions   Lactose Intolerance (gi)         Medication List     STOP taking these medications    citalopram 20 MG tablet Commonly known as: CELEXA   lactase 3000  units tablet Commonly known as: Lactaid   metroNIDAZOLE 500 MG tablet Commonly known as: FLAGYL       TAKE these medications    acetaminophen 325 MG tablet Commonly known as: Tylenol Take 2 tablets (650 mg total) by mouth every 4 (four) hours as needed (for pain scale < 4).   benzocaine-Menthol 20-0.5 % Aero Commonly known as: DERMOPLAST Apply 1 Application topically as needed for irritation (perineal discomfort).   furosemide 20 MG tablet Commonly known as: LASIX Take 1 tablet (20 mg total) by mouth 2 (two) times daily for 5 days.   ibuprofen 600 MG tablet Commonly known as: ADVIL Take 1 tablet (600 mg total) by mouth every 6 (six) hours.   multivitamin-prenatal 27-0.8 MG Tabs tablet Take 1 tablet by mouth daily at 12 noon.   senna-docusate 8.6-50 MG tablet Commonly known as: Senokot-S Take 2 tablets by mouth daily.         Discharge home in stable condition Infant Feeding: Bottle and Breast Infant Disposition:home with mother Discharge instruction: per After Visit  Summary and Postpartum booklet. Activity: Advance as tolerated. Pelvic rest for 6 weeks.  Diet: routine diet Future Appointments: Future Appointments  Date Time Provider [place]  07/19/2022  3:10 PM Aletha Halim, MD [facility]   Follow up Visit: Message sent to [facility] on 2/22  Please schedule this patient for a Virtual postpartum visit in 6 weeks with the following provider: Any provider. Additional Postpartum F/U:Postpartum Depression checkup and BP check 1 week  Low risk pregnancy complicated by:  N/A  Delivery mode:  Vaginal, Spontaneous  Anticipated Birth Control:  Depo   06/14/2022 [name], DO

## 2022-06-12 NOTE — Anesthesia Procedure Notes (Signed)
Epidural Patient location during procedure: OB Start time: 06/12/2022 10:58 AM End time: 06/12/2022 11:10 AM  Staffing Anesthesiologist: Lynda Rainwater, MD Performed: anesthesiologist   Preanesthetic Checklist Completed: patient identified, IV checked, site marked, risks and benefits discussed, surgical consent, monitors and equipment checked, pre-op evaluation and timeout performed  Epidural Patient position: sitting Prep: ChloraPrep Patient monitoring: heart rate, cardiac monitor, continuous pulse ox and blood pressure Approach: midline Location: L2-L3 Injection technique: LOR saline  Needle:  Needle type: Tuohy  Needle gauge: 17 G Needle length: 9 cm Needle insertion depth: 5 cm Catheter type: closed end flexible Catheter size: 20 Guage Catheter at skin depth: 9 cm Test dose: negative  Assessment Events: blood not aspirated, injection not painful, no injection resistance, no paresthesia and negative IV test  Additional Notes Reason for block:procedure for pain

## 2022-06-12 NOTE — MAU Note (Signed)
Pt says SROM at [place] [facility] 1 cm last Friday Uc's strong since 0400 Denies HSV GBS- neg

## 2022-06-12 NOTE — Lactation Note (Signed)
This note was copied from a baby's chart. Lactation Consultation Note  Patient Name: Kathy Bates [id number] Date: 06/12/2022 Reason for consult: Initial assessment;1st time breastfeeding;Early term 37-38.6wks;Difficult latch Age:24 hours LC entered the room, Birth Parent was hand expressing colostrum into a spoon, she plans to offer to infant after she latches him at the next feeding. Per Birth Parent, she feels breastfeeding is going well, infant has breastfeed 3 times since birth, 33 minutes in L&D, 10 minutes and then 14 minutes at 2051 pm, infant currently asleep in basinet. Birth Parent plans to continue to breastfeed infant according to hunger cues, on demand, 8 to 12+ times within 24 hours, STS. Birth Parent knows if she needs latch assistance to call RN/LC. LC discussed infant's input and output, per Birth Parent infant had one stool since birth. LC discussed importance of maternal diet, rest and hydration. Birth Parent was made aware of O/P services, breastfeeding support groups, community resources, and our phone # for post-discharge questions.   Maternal Data Has patient been taught Hand Expression?: Yes Does the patient have breastfeeding experience prior to this delivery?: No  Feeding Mother's Current Feeding Choice: Breast Milk  LATCH Score  No latch observed by [facility]                  Lactation Tools Discussed/Used    Interventions Interventions: Breast feeding basics reviewed;Skin to skin;Education;Hand express;LC Services brochure  Discharge Pump: DEBP  Consult Status Consult Status: Follow-up Date: 06/13/22 Follow-up type: In-patient    Eulis Canner 06/12/2022, 11:33 PM

## 2022-06-12 NOTE — Progress Notes (Signed)
Kathy Bates is a 22 y.o. G1P0000 at 24w5dby ultrasound admitted for rupture of membranes clear fluid about 3am.   Subjective: Patient very comfortable with epidural.   Objective: BP 118/81   Pulse (!) 101   Temp 98.9 F (37.2 C) (Oral)   Resp 18   Ht 5' 2"$  (1.575 m)   Wt 66.9 kg   LMP 08/21/2021 (Approximate)   SpO2 99%   BMI 26.98 kg/m  No intake/output data recorded. No intake/output data recorded.  FHT:  FHR: 140 bpm, variability: moderate,  accelerations:  Present,  decelerations:  Absent UC:   regular, every 2-3 minutes SVE:   Dilation: 7 Effacement (%): 80 Station: -1 Exam by:: A [employer] Lab Results  Component Value Date   WBC 12.3 (H) 06/12/2022   HGB 12.5 06/12/2022   HCT 34.5 (L) 06/12/2022   MCV 88.0 06/12/2022   PLT 122 (L) 06/12/2022  Patient Vitals for the past 24 hrs:  BP Temp Temp src Pulse Resp SpO2 Height Weight  06/12/22 1430 118/81 -- -- (!) 101 -- -- -- --  06/12/22 1424 -- 98.9 F (37.2 C) Oral -- -- -- -- --  06/12/22 1401 121/79 -- -- 82 -- -- -- --  06/12/22 1331 114/61 -- -- 85 -- -- -- --  06/12/22 1301 112/66 -- -- 88 -- -- -- --  06/12/22 1220 -- -- -- -- -- 99 % -- --  06/12/22 1215 -- -- -- -- -- 99 % -- --  06/12/22 1210 -- -- -- -- -- 99 % -- --  06/12/22 1205 -- -- -- -- -- 99 % -- --  06/12/22 1201 128/78 -- -- 78 18 -- -- --  06/12/22 1136 130/76 -- -- 85 -- -- -- --  06/12/22 1130 122/73 -- -- 82 -- -- -- --  06/12/22 1126 122/77 -- -- 79 18 -- -- --  06/12/22 1121 (!) 143/92 -- -- 71 -- -- -- --  06/12/22 1116 (!) 145/98 -- -- 71 -- -- -- --  06/12/22 1111 (!) 140/89 -- -- 82 -- -- -- --  06/12/22 1106 (!) 133/91 -- -- 77 -- -- -- --  06/12/22 1104 (!) 123/107 -- -- 75 -- -- -- --  06/12/22 1102 132/82 -- -- 80 -- -- -- --  06/12/22 0926 (!) 140/98 98 F (36.7 C) Oral 78 18 -- -- --  06/12/22 0855 -- -- -- -- -- 91 % -- --  06/12/22 0759 (!) 141/94 -- -- 82 -- -- -- --  06/12/22 0641 (!) 144/94 -- -- 73  -- -- -- --  06/12/22 0623 (!) 130/92 97.6 F (36.4 C) Oral 71 16 -- 5' 2"$  (1.575 m) 66.9 kg     Assessment / Plan: Spontaneous labor, progressing normally.   Labor: Progressing normally, plan to assess for pit if needed in 4 hours.  Preeclampsia:  no signs or symptoms of toxicity and labs stable. BPs improved following pain management.  Fetal Wellbeing:  Category II Pain Control:  Epidural I/D:   GBS Negative  Anticipated MOD:  NSVD  SJacquiline Doe CNM 06/12/2022, 3:08 PM

## 2022-06-12 NOTE — Plan of Care (Signed)
  Problem: Education: Goal: Knowledge of Childbirth will improve Outcome: Completed/Met Goal: Ability to make informed decisions regarding treatment and plan of care will improve Outcome: Completed/Met Goal: Ability to state and carry out methods to decrease the pain will improve Outcome: Completed/Met Goal: Individualized Educational Video(s) Outcome: Completed/Met   

## 2022-06-13 LAB — CBC
HCT: 30.1 % — ABNORMAL LOW (ref 36.0–46.0)
Hemoglobin: 10.4 g/dL — ABNORMAL LOW (ref 12.0–15.0)
MCH: 31.7 pg (ref 26.0–34.0)
MCHC: 34.6 g/dL (ref 30.0–36.0)
MCV: 91.8 fL (ref 80.0–100.0)
Platelets: 112 10*3/uL — ABNORMAL LOW (ref 150–400)
RBC: 3.28 MIL/uL — ABNORMAL LOW (ref 3.87–5.11)
RDW: 13 % (ref 11.5–15.5)
WBC: 14.8 10*3/uL — ABNORMAL HIGH (ref 4.0–10.5)
nRBC: 0 % (ref 0.0–0.2)

## 2022-06-13 NOTE — Anesthesia Postprocedure Evaluation (Signed)
Anesthesia Post Note  Patient: Kathy Bates  Procedure(s) Performed: AN AD HOC LABOR EPIDURAL     Patient location during evaluation: Mother Baby Anesthesia Type: Epidural Level of consciousness: awake, oriented and awake and alert Pain management: pain level controlled Vital Signs Assessment: post-procedure vital signs reviewed and stable Respiratory status: spontaneous breathing, respiratory function stable and nonlabored ventilation Cardiovascular status: stable Postop Assessment: no headache, adequate PO intake, able to ambulate, patient able to bend at knees, no apparent nausea or vomiting and no backache Anesthetic complications: no   No notable events documented.  Last Vitals:  Vitals:   06/13/22 0133 06/13/22 0548  BP: 122/75 126/77  Pulse: 94 78  Resp: 16   Temp: 36.8 C 36.5 C  SpO2:      Last Pain:  Vitals:   06/13/22 0548  TempSrc: Oral  PainSc:    Pain Goal: Patients Stated Pain Goal: Other (Comment) (patient is undecided, desires water birth) (06/12/22 0825)                 Marcy Siren

## 2022-06-13 NOTE — Progress Notes (Signed)
POSTPARTUM PROGRESS NOTE  Post Partum Day 1  Subjective:  Kathy Bates is a 22 y.o. G1P1001 s/p VD at [redacted]w[redacted]d  She reports she is doing well. No acute events overnight. She denies any problems with ambulating, voiding or po intake. Denies nausea or vomiting.  Pain is well controlled.  Lochia is minimal.  Objective: Blood pressure 126/77, pulse 78, temperature 97.7 F (36.5 C), temperature source Oral, resp. rate 16, height 5' 2"$  (1.575 m), weight 66.9 kg, last menstrual period 08/21/2021, SpO2 99 %, unknown if currently breastfeeding.  Physical Exam:  General: alert, cooperative and no distress Chest: no respiratory distress Heart:regular rate, distal pulses intact Abdomen: soft, nontender,  Uterine Fundus: firm, appropriately tender DVT Evaluation: No calf swelling or tenderness Extremities: No LE edema Skin: warm, dry  Recent Labs    06/12/22 0700 06/13/22 0602  HGB 12.5 10.4*  HCT 34.5* 30.1*    Assessment/Plan: EGIAHNA DINAis a 22y.o. G1P1001 s/p VD at 343w5d PPD#1 - Doing well  Routine postpartum care Thrombocytopenia- Plt count 122>112. Likely gestational. Follow up outpatient.  Contraception: Depo outpt Feeding: Breast Dispo: Plan for discharge 2/22.   LOS: 1 day   SiGerlene FeeDO OB Fellow, [facility] [facility]/21/2024, 6:36 AM

## 2022-06-13 NOTE — Progress Notes (Signed)
MOB was referred for history of depression/anxiety. * Referral screened out by Clinical Social Worker because none of the following criteria appear to apply: ~ History of anxiety/depression during this pregnancy, or of post-partum depression following prior delivery. No concerns of anxiety/depression noted in prenatal records.  ~ Diagnosis of anxiety and/or depression within last 3 years. Per chart review, MOB's anxiety dates back to 2017 and depression dates back to 2018.  OR * MOB's symptoms currently being treated with medication and/or therapy.  Please contact the Clinical Social Worker if needs arise, by [facility] request, or if MOB scores greater than 9/yes to question 10 on Edinburgh Postpartum Depression Screen.  Abundio Miu, [place] Base Worker [facility] Cell#: [phone]

## 2022-06-14 ENCOUNTER — Other Ambulatory Visit (HOSPITAL_COMMUNITY): Payer: Self-pay

## 2022-06-14 ENCOUNTER — Encounter: Payer: Medicaid Other | Admitting: Advanced Practice Midwife

## 2022-06-14 MED ORDER — KETOROLAC TROMETHAMINE 30 MG/ML IJ SOLN: 30.0000 mg | Freq: Once | INTRAMUSCULAR | Status: DC

## 2022-06-14 MED ORDER — NIFEDIPINE ER OSMOTIC RELEASE 30 MG PO TB24
30.0000 mg | ORAL_TABLET | Freq: Every day | ORAL | Status: DC
  Administered 2022-06-14 – 2022-06-15 (×2): 30 mg via ORAL
  Filled 2022-06-14 (×2): qty 1

## 2022-06-14 MED ORDER — ACETAMINOPHEN 325 MG PO TABS
650.0000 mg | ORAL_TABLET | ORAL | 0 refills | Status: AC | PRN
  Filled 2022-06-14: qty 30, 3d supply, fill #0

## 2022-06-14 MED ORDER — FUROSEMIDE 20 MG PO TABS
20.0000 mg | ORAL_TABLET | Freq: Two times a day (BID) | ORAL | 0 refills | Status: DC
  Filled 2022-06-14: qty 10, 5d supply, fill #0

## 2022-06-14 MED ORDER — SENNOSIDES-DOCUSATE SODIUM 8.6-50 MG PO TABS
2.0000 | ORAL_TABLET | Freq: Every day | ORAL | 0 refills | Status: DC
  Filled 2022-06-14: qty 30, 15d supply, fill #0

## 2022-06-14 MED ORDER — FUROSEMIDE 20 MG PO TABS
20.0000 mg | ORAL_TABLET | Freq: Two times a day (BID) | ORAL | Status: DC
  Administered 2022-06-14 – 2022-06-15 (×3): 20 mg via ORAL
  Filled 2022-06-14 (×3): qty 1

## 2022-06-14 MED ORDER — NIFEDIPINE ER 30 MG PO TB24
30.0000 mg | ORAL_TABLET | Freq: Every day | ORAL | 0 refills | Status: DC
  Filled 2022-06-14: qty 30, 30d supply, fill #0

## 2022-06-14 MED ORDER — BENZOCAINE-MENTHOL 20-0.5 % EX AERO
1.0000 | INHALATION_SPRAY | CUTANEOUS | 0 refills | Status: DC | PRN
  Filled 2022-06-14: qty 78, fill #0

## 2022-06-14 MED ORDER — IBUPROFEN 600 MG PO TABS
600.0000 mg | ORAL_TABLET | Freq: Four times a day (QID) | ORAL | 0 refills | Status: DC
  Filled 2022-06-14: qty 30, 8d supply, fill #0

## 2022-06-14 NOTE — Discharge Summary (Signed)
Postpartum Discharge Summary    Patient Name: Kathy Bates DOB: 07-09-2000 MRN: [id number]  Date of admission: 06/12/2022 Delivery date:06/12/2022  Delivering provider: Deloris Ping  Date of discharge: 06/15/2022  Admitting diagnosis: Indication for care in labor or delivery [O75.9] Intrauterine pregnancy: [redacted]w[redacted]d    Secondary diagnosis:  Principal Problem:   Vaginal delivery Active Problems:   Generalized anxiety disorder   Supervision of normal first pregnancy, antepartum   Thrombocythemia  Additional problems: n/a    Discharge diagnosis: Term Pregnancy Delivered                                              Post partum procedures: n/a Augmentation: N/A Complications: None  Hospital course: Onset of Labor With Vaginal Delivery      22y.o. yo G1P0000 at 343w5das admitted in Latent Labor on 06/12/2022. Labor course was uncomplicated.   Membrane Rupture Time/Date: 3:00 AM ,06/12/2022   Delivery Method:Vaginal, Spontaneous  Episiotomy: None  Lacerations:  Periurethral  Patient had a postpartum course complicated by gHTN and HA. Started lasix and procardia. Anesthesia evaluated for spinal HA and deemed not likely cause of HA. BP improved with treatment.  She is ambulating, tolerating a regular diet, passing flatus, and urinating well. Patient is discharged home in stable condition on 06/15/22.  Newborn Data: Birth date:06/12/2022  Birth time:5:50 PM  Gender:Female  Living status:Living  Apgars:9 ,9  Weight:3320 g   Magnesium Sulfate received: No BMZ received: No Rhophylac:N/A MMR:N/A T-DaP: not done Flu: No Transfusion:No  Physical exam  Vitals:   06/14/22 1026 06/14/22 1708 06/14/22 2128 06/15/22 0614  BP: 124/76 128/83 136/89 120/75  Pulse: 88 81 83 71  Resp: '16 16 17 16  '$ Temp: 98.2 F (36.8 C) 98.1 F (36.7 C) 98.3 F (36.8 C) 98.4 F (36.9 C)  TempSrc: Oral Oral Oral Oral  SpO2: 97% 95% 98% 97%  Weight:      Height:       General: alert,  cooperative, and no distress Lochia: appropriate Uterine Fundus: firm Incision: N/A DVT Evaluation: No evidence of DVT seen on physical exam. Labs: Lab Results  Component Value Date   WBC 9.7 06/15/2022   HGB 11.9 (L) 06/15/2022   HCT 32.8 (L) 06/15/2022   MCV 88.6 06/15/2022   PLT 162 06/15/2022      Latest Ref Rng & Units 06/15/2022    5:14 AM  CMP  Glucose 70 - 99 mg/dL 81   BUN 6 - 20 mg/dL 7   Creatinine 0.44 - 1.00 mg/dL 0.53   Sodium 135 - 145 mmol/L 137   Potassium 3.5 - 5.1 mmol/L 3.3   Chloride 98 - 111 mmol/L 99   CO2 22 - 32 mmol/L 26   Calcium 8.9 - 10.3 mg/dL 9.2   Total Protein 6.5 - 8.1 g/dL 6.1   Total Bilirubin 0.3 - 1.2 mg/dL 0.5   Alkaline Phos 38 - 126 U/L 131   AST 15 - 41 U/L 27   ALT 0 - 44 U/L 17    Edinburgh Score:     No data to display           After visit meds:  Allergies as of 06/15/2022       Reactions   Lactose Intolerance (gi)         Medication List  STOP taking these medications    citalopram 20 MG tablet Commonly known as: CELEXA   lactase 3000 units tablet Commonly known as: Lactaid   metroNIDAZOLE 500 MG tablet Commonly known as: FLAGYL       TAKE these medications    acetaminophen 325 MG tablet Commonly known as: Tylenol Take 2 tablets (650 mg total) by mouth every 4 (four) hours as needed (for pain scale < 4).   benzocaine-Menthol 20-0.5 % Aero Commonly known as: DERMOPLAST Apply 1 Application topically as needed for irritation (perineal discomfort).   furosemide 20 MG tablet Commonly known as: LASIX Take 1 tablet (20 mg total) by mouth 2 (two) times daily for 5 days.   ibuprofen 600 MG tablet Commonly known as: ADVIL Take 1 tablet (600 mg total) by mouth every 6 (six) hours.   multivitamin-prenatal 27-0.8 MG Tabs tablet Take 1 tablet by mouth daily at 12 noon.   NIFEdipine 30 MG 24 hr tablet Commonly known as: ADALAT CC Take 1 tablet (30 mg total) by mouth daily.   Senexon-S 8.6-50  MG tablet Generic drug: senna-docusate Take 2 tablets by mouth daily.         Discharge home in stable condition Infant Feeding: Bottle and Breast Infant Disposition:home with mother Discharge instruction: per After Visit Summary and Postpartum booklet. Activity: Advance as tolerated. Pelvic rest for 6 weeks.  Diet: routine diet Future Appointments: Future Appointments  Date Time Provider [place]  06/20/2022  1:50 PM [facility] NURSE [facility]  07/19/2022  3:10 PM Aletha Halim, MD [facility]   Follow up Visit: Message sent to [employer] [facility] on 2/22  Please schedule this patient for a Virtual postpartum visit in 6 weeks with the following provider: Any provider. Additional Postpartum F/U:Postpartum Depression checkup and BP check 1 week  Low risk pregnancy complicated by:  N/A  Delivery mode:  Vaginal, Spontaneous  Anticipated Birth Control:  Depo   06/15/2022 [name], DO

## 2022-06-14 NOTE — Progress Notes (Signed)
Pt c/o HA that has not improved with medication. She does c/o associated back pain, some difficulty extending her back and getting up from her bed. She did have an epidural and noted that she had some leakage from her back when the epidural catheter was removed. On exam, no pain in extension or flexion of head. Bandage on epidural catheter site.   Plan to give toradol for HA and call anesthesia to evaluate for mild spinal HA.    [name], [place] Fellow, Faculty practice [place] for [facility] 06/14/22  8:09 AM

## 2022-06-14 NOTE — Progress Notes (Signed)
Called received from primary team regarding patient headache. Chart reviewed, no evidence of dural puncture. Case also discussed with anesthesiologist who placed the labor epidural. Patient evaluated at the bedside. Would recommend conservative treatment for headache at this time. More aggressive options, including epidural blood patch discussed.

## 2022-06-14 NOTE — Progress Notes (Signed)
Post Partum Day 2 Subjective: no complaints and patient desires to rest, but otherwise doing well.   Objective: Blood pressure 137/84, pulse 78, temperature 98.7 F (37.1 C), temperature source Oral, resp. rate 18, height 5' 2"$  (1.575 m), weight 66.9 kg, last menstrual period 08/21/2021, SpO2 99 %, unknown if currently breastfeeding.   Recent Labs    06/12/22 0700 06/13/22 0602  HGB 12.5 10.4*  HCT 34.5* 30.1*    Assessment/Plan: CNM to bedside to discuss keeping patient an additional day, due to elevated BPs in the presence of a unrelieved headache. Patient agreeable to plan. Repeat labs in AM.  Plan for discharge tomorrow   LOS: 2 days   Jacquiline Doe, CNM 06/14/2022, 10:10 AM

## 2022-06-14 NOTE — Lactation Note (Signed)
This note was copied from a baby's chart. Lactation Consultation Note  Patient Name: Kathy Bates [id number] Date: 06/14/2022  Reason for consult: Follow-up assessment;Primapara;1st time breastfeeding;Early term 37-38.6wks;Mother's request  Age:24 hours P1, 37.5 GA  Infant had just fed and sleepy now. Mother has questions about latch, nipple soreness and engorgement.   Basic teaching about latch regarding pillow support (demonstrated)  and depth on the breast for optimal milk transfer and avoiding nipple damage.Instructed to call RN /LC to assess the latch.  Mother's breast filling and breast are leaking. She has been collecting milk in the bottle from the other breast when baby is breastfeeding or she is using the hand pump to collect milk. Infant is being fed mother's EBM by bottle. Small blister noted on the tip of the right nipple and sore. Given breast shells and coconut oil and instructed about comfort gel to promote healing. Instructed to alternate coconut oil with comfort gel.   Education related to management of engorgement should it occur and request for [employer] pump was made. Mother has a used DEBP at home. DEBP was set up and instructions were given on flange size fit, cleaning and storage. A 24 flange fits best on right breast and 24 flange on left.   Lactation Tools Discussed/Used Tools: Pump;Flanges;Coconut oil;Comfort gels;Shells Flange Size: 21 Breast pump type: Double-Electric Breast Pump;Manual Pump Education: Setup, frequency, and cleaning;Milk Storage Reason for Pumping: breast becoming firm. early term infant Pumping frequency: for comfort and stimulation Pumped volume: 15 mL  Interventions Interventions: Breast feeding basics reviewed;Hand express;Shells;Comfort gels;DEBP;Education  Discharge Discharge Education: Engorgement and breast care;Warning signs for feeding baby Pump: DEBP;Personal;Manual  Consult Status Consult Status: Follow-up Date:  06/15/22 Follow-up type: In-patient    Stana Bunting M 06/14/2022, 4:56 PM

## 2022-06-15 LAB — CBC
HCT: 32.8 % — ABNORMAL LOW (ref 36.0–46.0)
Hemoglobin: 11.9 g/dL — ABNORMAL LOW (ref 12.0–15.0)
MCH: 32.2 pg (ref 26.0–34.0)
MCHC: 36.3 g/dL — ABNORMAL HIGH (ref 30.0–36.0)
MCV: 88.6 fL (ref 80.0–100.0)
Platelets: 162 10*3/uL (ref 150–400)
RBC: 3.7 MIL/uL — ABNORMAL LOW (ref 3.87–5.11)
RDW: 12.8 % (ref 11.5–15.5)
WBC: 9.7 10*3/uL (ref 4.0–10.5)
nRBC: 0 % (ref 0.0–0.2)

## 2022-06-15 LAB — COMPREHENSIVE METABOLIC PANEL
ALT: 17 U/L (ref 0–44)
AST: 27 U/L (ref 15–41)
Albumin: 2.7 g/dL — ABNORMAL LOW (ref 3.5–5.0)
Alkaline Phosphatase: 131 U/L — ABNORMAL HIGH (ref 38–126)
Anion gap: 12 (ref 5–15)
BUN: 7 mg/dL (ref 6–20)
CO2: 26 mmol/L (ref 22–32)
Calcium: 9.2 mg/dL (ref 8.9–10.3)
Chloride: 99 mmol/L (ref 98–111)
Creatinine, Ser: 0.53 mg/dL (ref 0.44–1.00)
GFR, Estimated: >60 mL/min (ref >60)
Glucose, Bld: 81 mg/dL (ref 70–99)
Potassium: 3.3 mmol/L — ABNORMAL LOW (ref 3.5–5.1)
Sodium: 137 mmol/L (ref 135–145)
Total Bilirubin: 0.5 mg/dL (ref 0.3–1.2)
Total Protein: 6.1 g/dL — ABNORMAL LOW (ref 6.5–8.1)

## 2022-06-15 LAB — BIRTH TISSUE RECOVERY COLLECTION (PLACENTA DONATION)

## 2022-06-15 NOTE — Progress Notes (Signed)
Patient was not able to download babyscripts app on  her own cell phone due to storage capacity.  She did download it on her grandmother's cell phone but was  not able to complete the log in and was not able to enter her BP's in the app.  BP cuff given and she demonstrated proper use of it.  Pre-E signs/symptoms were reviewed in detail and also she was educated on the need to notify OB provider if her BP was greater than 140/90.  Grandmother (support person) is a retired NP and seemed to be very knowledgeable regarding safety/emergency situations.

## 2022-06-15 NOTE — Lactation Note (Signed)
This note was copied from a baby's chart. Lactation Consultation Note  Patient Name: Kathy Bates [id number] Date: 06/15/2022 Reason for consult: Follow-up assessment;Primapara;1st time breastfeeding;Early term 37-38.6wks;Breastfeeding assistance;Infant weight loss (3.16% WL) Age:24 hours  LC entered the room and the infant was asleep in the bassinet.  Per the birth parent, the infant has been doing well at the breast.  She stated that he feeds for about 30 min on each side.  LC encouraged the birth parent to avoid keeping the infant on the breast for more than 45 min.  [place] spoke with the birth parent about engorgement, mastitis, breast care, warning signs, infant I/O, and outpatient lactation services.  The birth parent had no further questions or concerns.   Infant Feeding Plan:  Breastfeed 8+ times in 24 hours according to feeding cues.  Watch infant output and call the pediatrician with questions or concerns.  Call the outpatient [facility] for assistance with breastfeeding.   Interventions Interventions: Education  Discharge Discharge Education: Engorgement and breast care;Warning signs for feeding baby;Outpatient recommendation  Consult Status Consult Status: Complete Date: 06/15/22 Follow-up type: Call as needed    Lysbeth Penner 06/15/2022, 3:48 PM

## 2022-06-20 ENCOUNTER — Ambulatory Visit: Payer: Medicaid Other

## 2022-06-21 ENCOUNTER — Telehealth (HOSPITAL_COMMUNITY): Payer: Self-pay | Admitting: *Deleted

## 2022-06-21 ENCOUNTER — Encounter: Payer: Self-pay | Admitting: Obstetrics and Gynecology

## 2022-06-21 NOTE — Telephone Encounter (Signed)
Attempted Hospital Discharge Follow-Up Call.  Voice mail box is full.  Unable to leave a message.

## 2022-07-19 ENCOUNTER — Encounter: Payer: Self-pay | Admitting: Obstetrics and Gynecology

## 2022-07-19 ENCOUNTER — Ambulatory Visit (INDEPENDENT_AMBULATORY_CARE_PROVIDER_SITE_OTHER): Payer: Medicaid Other | Admitting: Obstetrics and Gynecology

## 2022-07-19 VITALS — BP 116/81 | HR 97 | Wt 120.0 lb

## 2022-07-19 DIAGNOSIS — Z30430 Encounter for insertion of intrauterine contraceptive device: Secondary | ICD-10-CM | POA: Diagnosis not present

## 2022-07-19 DIAGNOSIS — O165 Unspecified maternal hypertension, complicating the puerperium: Secondary | ICD-10-CM | POA: Diagnosis not present

## 2022-07-19 HISTORY — PX: IUD INSERTION: OBO1003

## 2022-07-19 MED ORDER — LEVONORGESTREL 20 MCG/DAY IU IUD
1.0000 | INTRAUTERINE_SYSTEM | Freq: Once | INTRAUTERINE | Status: AC
  Administered 2022-07-19: 1 via INTRAUTERINE

## 2022-07-19 NOTE — Progress Notes (Signed)
    [place] Partum Visit Note  Kathy Bates is a 22 y.o. G1P1001 s/p 2/20 SVD/intact perineum (bilateral labia lacerations, repaired). Her pregnancy only c/b PP GHTN and pt started on lasix and procardia.   Anesthesia: epidural. Postpartum course has been uncomplicated. Baby is doing well. Baby is feeding by breast. Bleeding no bleeding. Bowel function is normal. Bladder function is normal. Patient is not sexually active. Contraception method is abstinence. Postpartum depression screening: negative.   Edinburgh Postnatal Depression Scale - 07/19/22 1522       Edinburgh Postnatal Depression Scale:  In the Past 7 Days   I have been able to laugh and see the funny side of things. 0    I have looked forward with enjoyment to things. 0    I have blamed myself unnecessarily when things went wrong. 0    I have been anxious or worried for no good reason. 0    I have felt scared or panicky for no good reason. 0    Things have been getting on top of me. 0    I have been so unhappy that I have had difficulty sleeping. 0    I have felt sad or miserable. 0    I have been so unhappy that I have been crying. 0    The thought of harming myself has occurred to me. 0    Edinburgh Postnatal Depression Scale Total 0             Review of Systems A comprehensive review of systems was negative.  Objective:  BP 116/81   Pulse 97   Wt 120 lb (54.4 kg)   BMI 21.95 kg/m    General: NAD EGBUS, vagina and cervix wnl  See procedure note for Mirena IUD insertion.   Assessment:   Normal postpartum exam.   Plan:  Routine PP care. Patient has used depo in the past. Options d/w her and she elects for Mirena today, which was placed. Pap smear neg 2023  CV: stop procardia (lasix already finished). And RN bp check in 7-10d   RTC 7-10d for BP check and 6 weeks for string check.   Aletha Halim, MD Center for [place]

## 2022-07-19 NOTE — Procedures (Signed)
Intrauterine Device (IUD) Insertion Procedure Note  After a recent pap and recent negative gonorrhea-chlamydia testing was confirmed, written consent was obtained  A bimanual exam showed the uterus to be midposition.  Next, the cervix and vagina were cleaned with an antiseptic solution, and the cervix was grasped with a tenaculum.  The uterus was sounded to  7.5  cm.  The Mirena was placed without difficulty in the usual fashion.  The strings were cut to 3-4 cm.  The tenaculum was removed and cervix was found to be hemostatic.    No complications, patient tolerated the procedure well.  Durene Romans MD Attending Center for [employer] farm manager)

## 2022-07-20 ENCOUNTER — Telehealth (HOSPITAL_COMMUNITY): Payer: Self-pay

## 2022-07-20 NOTE — Telephone Encounter (Signed)
Preadmission testing 

## 2022-07-31 ENCOUNTER — Ambulatory Visit: Payer: Medicaid Other

## 2022-09-06 ENCOUNTER — Ambulatory Visit: Payer: Medicaid Other | Admitting: Advanced Practice Midwife

## 2023-01-12 ENCOUNTER — Other Ambulatory Visit: Payer: Self-pay | Admitting: Family Medicine

## 2023-01-12 DIAGNOSIS — F331 Major depressive disorder, recurrent, moderate: Secondary | ICD-10-CM

## 2023-10-16 ENCOUNTER — Encounter: Payer: Self-pay | Admitting: Certified Nurse Midwife

## 2023-10-16 ENCOUNTER — Ambulatory Visit (INDEPENDENT_AMBULATORY_CARE_PROVIDER_SITE_OTHER): Admitting: Certified Nurse Midwife

## 2023-10-16 VITALS — BP 106/73 | HR 76 | Wt 114.0 lb

## 2023-10-16 DIAGNOSIS — Z3009 Encounter for other general counseling and advice on contraception: Secondary | ICD-10-CM

## 2023-10-16 DIAGNOSIS — Z113 Encounter for screening for infections with a predominantly sexual mode of transmission: Secondary | ICD-10-CM | POA: Diagnosis not present

## 2023-10-16 DIAGNOSIS — Z01419 Encounter for gynecological examination (general) (routine) without abnormal findings: Secondary | ICD-10-CM | POA: Diagnosis not present

## 2023-10-16 MED ORDER — SCOPOLAMINE 1 MG/3DAYS TD PT72: 1.0000 | MEDICATED_PATCH | TRANSDERMAL | 1 refills | Status: DC

## 2023-10-16 MED ORDER — CITALOPRAM HYDROBROMIDE 20 MG PO TABS: 20.0000 mg | ORAL_TABLET | Freq: Every day | ORAL | 3 refills | Status: AC

## 2023-10-16 MED ORDER — SCOPOLAMINE 1 MG/3DAYS TD PT72: 1.0000 | MEDICATED_PATCH | TRANSDERMAL | 1 refills | Status: AC

## 2023-10-16 NOTE — Progress Notes (Signed)
   GYNECOLOGY OFFICE VISIT NOTE  History:   Kathy Bates is a 23 y.o. G1P1001 here today for annual well woman visit. . She denies any abnormal vaginal discharge, bleeding, pelvic pain or other concerns.     Past Medical History:  Diagnosis Date   ADHD (attention deficit hyperactivity disorder)    Moderate episode of recurrent major depressive disorder (HCC) 08/23/2021   Parent-child relational problem 10/18/2015   Thrombocythemia 06/12/2022    Past Surgical History:  Procedure Laterality Date   IUD INSERTION  07/19/2022    The following portions of the patient's history were reviewed and updated as appropriate: allergies, current medications, past family history, past medical history, past social history, past surgical history and problem list.   Health Maintenance:  Normal pap and negative HRHPV on 08/23/21.   Review of Systems:  Pertinent items noted in HPI and remainder of comprehensive ROS otherwise negative.  Physical Exam:  BP 106/73   Pulse 76   Wt 114 lb (51.7 kg)   Breastfeeding Yes   BMI 20.85 kg/m  CONSTITUTIONAL: Well-developed, well-nourished female in no acute distress.  HEENT:  Normocephalic, atraumatic. External right and left ear normal. No scleral icterus.  NECK: Normal range of motion, supple, no masses noted on observation SKIN: No rash noted. Not diaphoretic. No erythema. No pallor. MUSCULOSKELETAL: Normal range of motion. No edema noted. NEUROLOGIC: Alert and oriented to person, place, and time. Normal muscle tone coordination. No cranial nerve deficit noted. PSYCHIATRIC: Normal mood and affect. Normal behavior. Normal judgment and thought content. CARDIOVASCULAR: Normal heart rate noted RESPIRATORY: Effort and breath sounds normal, no problems with respiration noted ABDOMEN: No masses noted. No other overt distention noted.   PELVIC: Deferred  Labs and Imaging No results found for this or any previous visit (from the past week). No results  found.    Assessment and Plan:    1. Encounter for well woman exam (Primary) - Patient doing well.  - She is currently still breastfeeding. Deferred  - Rx for Celexa  Refilled.   2. Screening examination for STD (sexually transmitted disease) - Declined    Routine preventative health maintenance measures emphasized. Please refer to After Visit Summary for other counseling recommendations.   Return in about 1 year (around 10/15/2024) for [place].    I spent 30 minutes dedicated to the care of this patient including pre-visit review of records, face to face time with the patient discussing her conditions and treatments and post visit orders.    Anaja Monts Erven) Emilio, MSN, CNM  Center for [facility]  10/16/23 1:27 PM

## 2024-02-05 ENCOUNTER — Ambulatory Visit: Payer: Self-pay | Admitting: Certified Nurse Midwife

## 2024-02-05 VITALS — BP 112/77 | HR 77 | Wt 112.0 lb

## 2024-02-05 DIAGNOSIS — Z30013 Encounter for initial prescription of injectable contraceptive: Secondary | ICD-10-CM

## 2024-02-05 DIAGNOSIS — Z3009 Encounter for other general counseling and advice on contraception: Secondary | ICD-10-CM

## 2024-02-05 DIAGNOSIS — M549 Dorsalgia, unspecified: Secondary | ICD-10-CM

## 2024-02-05 DIAGNOSIS — M419 Scoliosis, unspecified: Secondary | ICD-10-CM

## 2024-02-05 DIAGNOSIS — Z3042 Encounter for surveillance of injectable contraceptive: Secondary | ICD-10-CM

## 2024-02-05 DIAGNOSIS — Z30432 Encounter for removal of intrauterine contraceptive device: Secondary | ICD-10-CM

## 2024-02-05 MED ORDER — MEDROXYPROGESTERONE ACETATE 150 MG/ML IM SUSY
150.0000 mg | PREFILLED_SYRINGE | Freq: Once | INTRAMUSCULAR | Status: AC
  Administered 2024-02-05: 150 mg via INTRAMUSCULAR

## 2024-02-05 NOTE — Progress Notes (Signed)
    GYNECOLOGY CLINIC PROCEDURE NOTE  Ms. Kathy Bates is a 23 y.o. G1P1001 here for Mirena  IUD removal. Patient having pain with intercourse and occasional shooting pain since placement  and desires to have device removed. No GYN concerns.  Last pap smear was on 08/2021 and was normal.   She plans Depo as her contraceptive method.   IUD Removal   Patient was in the dorsal lithotomy position, normal external genitalia was noted. A speculum was placed in the patient's vagina, normal discharge was noted, no lesions. The cervix was visualized, no lesions, no abnormal discharge. The strings of the IUD were grasped and pulled using ring forceps. The IUD was removed in its entirety. Patient tolerated the procedure well.     Emilio Delilah HERO, CNM 02/05/2024 12:46 PM

## 2024-02-05 NOTE — Progress Notes (Signed)
 Pt want IUD removed and Depo.  CC: Pain  Pt wants referral for back to orthopedic for scoliosis .

## 2024-04-24 ENCOUNTER — Ambulatory Visit: Payer: Self-pay

## 2024-04-30 ENCOUNTER — Ambulatory Visit

## 2024-06-03 ENCOUNTER — Ambulatory Visit: Admitting: Certified Nurse Midwife
